# Patient Record
Sex: Female | Born: 1980 | Race: White | Hispanic: No | Marital: Single | Smoking: Never smoker
Health system: Southern US, Community
[De-identification: ages and names within clinical notes are randomized; demographics above are authoritative.]

## PROBLEM LIST (undated history)

## (undated) DIAGNOSIS — A630 Anogenital (venereal) warts: Secondary | ICD-10-CM

## (undated) DIAGNOSIS — F419 Anxiety disorder, unspecified: Secondary | ICD-10-CM

## (undated) DIAGNOSIS — T7840XA Allergy, unspecified, initial encounter: Secondary | ICD-10-CM

## (undated) DIAGNOSIS — D649 Anemia, unspecified: Secondary | ICD-10-CM

## (undated) DIAGNOSIS — R87629 Unspecified abnormal cytological findings in specimens from vagina: Secondary | ICD-10-CM

## (undated) DIAGNOSIS — S3992XA Unspecified injury of lower back, initial encounter: Secondary | ICD-10-CM

## (undated) DIAGNOSIS — S62609A Fracture of unspecified phalanx of unspecified finger, initial encounter for closed fracture: Secondary | ICD-10-CM

## (undated) DIAGNOSIS — J45909 Unspecified asthma, uncomplicated: Secondary | ICD-10-CM

## (undated) DIAGNOSIS — Z9889 Other specified postprocedural states: Secondary | ICD-10-CM

## (undated) HISTORY — PX: FRACTURE SURGERY: SHX138

## (undated) HISTORY — DX: Allergy, unspecified, initial encounter: T78.40XA

## (undated) HISTORY — PX: CERVICAL BIOPSY  W/ LOOP ELECTRODE EXCISION: SUR135

## (undated) HISTORY — DX: Unspecified abnormal cytological findings in specimens from vagina: R87.629

## (undated) HISTORY — PX: SPINE SURGERY: SHX786

## (undated) HISTORY — PX: BACK SURGERY: SHX140

## (undated) HISTORY — DX: Anogenital (venereal) warts: A63.0

## (undated) HISTORY — DX: Anemia, unspecified: D64.9

## (undated) HISTORY — PX: NASAL SINUS SURGERY: SHX719

---

## 2009-03-27 DIAGNOSIS — Z9889 Other specified postprocedural states: Secondary | ICD-10-CM

## 2009-03-27 HISTORY — DX: Other specified postprocedural states: Z98.890

## 2015-12-17 DIAGNOSIS — G8929 Other chronic pain: Secondary | ICD-10-CM | POA: Insufficient documentation

## 2016-05-05 DIAGNOSIS — Z8781 Personal history of (healed) traumatic fracture: Secondary | ICD-10-CM

## 2016-05-05 DIAGNOSIS — E041 Nontoxic single thyroid nodule: Secondary | ICD-10-CM | POA: Insufficient documentation

## 2016-05-05 HISTORY — DX: Personal history of (healed) traumatic fracture: Z87.81

## 2018-09-30 ENCOUNTER — Inpatient Hospital Stay (HOSPITAL_COMMUNITY): Payer: 59

## 2018-09-30 ENCOUNTER — Other Ambulatory Visit: Payer: Self-pay

## 2018-09-30 ENCOUNTER — Encounter (HOSPITAL_COMMUNITY): Payer: Self-pay | Admitting: *Deleted

## 2018-09-30 ENCOUNTER — Inpatient Hospital Stay (HOSPITAL_COMMUNITY)
Admission: AD | Admit: 2018-09-30 | Discharge: 2018-09-30 | Disposition: A | Discharge status: Home / Self Care | Payer: 59 | Attending: Obstetrics and Gynecology | Admitting: Obstetrics and Gynecology

## 2018-09-30 DIAGNOSIS — O4690 Antepartum hemorrhage, unspecified, unspecified trimester: Secondary | ICD-10-CM

## 2018-09-30 DIAGNOSIS — O4691 Antepartum hemorrhage, unspecified, first trimester: Secondary | ICD-10-CM

## 2018-09-30 DIAGNOSIS — O021 Missed abortion: Secondary | ICD-10-CM | POA: Insufficient documentation

## 2018-09-30 DIAGNOSIS — Z3A12 12 weeks gestation of pregnancy: Secondary | ICD-10-CM

## 2018-09-30 DIAGNOSIS — Z6790 Unspecified blood type, Rh positive: Secondary | ICD-10-CM

## 2018-09-30 HISTORY — DX: Other specified postprocedural states: Z98.890

## 2018-09-30 HISTORY — DX: Fracture of unspecified phalanx of unspecified finger, initial encounter for closed fracture: S62.609A

## 2018-09-30 HISTORY — DX: Unspecified injury of lower back, initial encounter: S39.92XA

## 2018-09-30 LAB — CBC
HCT: 41.9 % (ref 36.0–46.0)
Hemoglobin: 14.1 g/dL (ref 12.0–15.0)
MCH: 29.6 pg (ref 26.0–34.0)
MCHC: 33.7 g/dL (ref 30.0–36.0)
MCV: 87.8 fL (ref 80.0–100.0)
Platelets: 378 10*3/uL (ref 150–400)
RBC: 4.77 MIL/uL (ref 3.87–5.11)
RDW: 12.5 % (ref 11.5–15.5)
WBC: 10.5 10*3/uL (ref 4.0–10.5)
nRBC: 0 % (ref 0.0–0.2)

## 2018-09-30 LAB — POCT PREGNANCY, URINE: Preg Test, Ur: POSITIVE — AB

## 2018-09-30 MED ORDER — HYDROCODONE-ACETAMINOPHEN 5-325 MG PO TABS: 1.0000 | ORAL_TABLET | Freq: Four times a day (QID) | ORAL | 0 refills | Status: DC | PRN

## 2018-09-30 NOTE — Discharge Instructions (Signed)
Miscarriage °A miscarriage is the loss of an unborn baby (fetus) before the 20th week of pregnancy. Most miscarriages happen during the first 3 months of pregnancy. Sometimes, a miscarriage can happen before a woman knows that she is pregnant. °Having a miscarriage can be an emotional experience. If you have had a miscarriage, talk with your health care provider about any questions you may have about miscarrying, the grieving process, and your plans for future pregnancy. °What are the causes? °A miscarriage may be caused by: °· Problems with the genes or chromosomes of the fetus. These problems make it impossible for the baby to develop normally. They are often the result of random errors that occur early in the development of the baby, and are not passed from parent to child (not inherited). °· Infection of the cervix or uterus. °· Conditions that affect hormone balance in the body. °· Problems with the cervix, such as the cervix opening and thinning before pregnancy is at term (cervical insufficiency). °· Problems with the uterus. These may include: °? A uterus with an abnormal shape. °? Fibroids in the uterus. °? Congenital abnormalities. These are problems that were present at birth. °· Certain medical conditions. °· Smoking, drinking alcohol, or using drugs. °· Injury (trauma). °In many cases, the cause of a miscarriage is not known. °What are the signs or symptoms? °Symptoms of this condition include: °· Vaginal bleeding or spotting, with or without cramps or pain. °· Pain or cramping in the abdomen or lower back. °· Passing fluid, tissue, or blood clots from the vagina. °How is this diagnosed? °This condition may be diagnosed based on: °· A physical exam. °· Ultrasound. °· Blood tests. °· Urine tests. °How is this treated? °Treatment for a miscarriage is sometimes not necessary if you naturally pass all the tissue that was in your uterus. If necessary, this condition may be treated with: °· Dilation and  curettage (D&C). This is a procedure in which the cervix is stretched open and the lining of the uterus (endometrium) is scraped. This is done only if tissue from the fetus or placenta remains in the body (incomplete miscarriage). °· Medicines, such as: °? Antibiotic medicine, to treat infection. °? Medicine to help the body pass any remaining tissue. °? Medicine to reduce (contract) the size of the uterus. These medicines may be given if you have a lot of bleeding. °If you have Rh negative blood and your baby was Rh positive, you will need a shot of a medicine called Rh immunoglobulinto protect your future babies from Rh blood problems. "Rh-negative" and "Rh-positive" refer to whether or not the blood has a specific protein found on the surface of red blood cells (Rh factor). °Follow these instructions at home: °Medicines ° °· Take over-the-counter and prescription medicines only as told by your health care provider. °· If you were prescribed antibiotic medicine, take it as told by your health care provider. Do not stop taking the antibiotic even if you start to feel better. °· Do not take NSAIDs, such as aspirin and ibuprofen, unless they are approved by your health care provider. These medicines can cause bleeding. °Activity °· Rest as directed. Ask your health care provider what activities are safe for you. °· Have someone help with home and family responsibilities during this time. °General instructions °· Keep track of the number of sanitary pads you use each day and how soaked (saturated) they are. Write down this information. °· Monitor the amount of tissue or blood clots that   you pass from your vagina. Save any large amounts of tissue for your health care provider to examine. °· Do not use tampons, douche, or have sex until your health care provider approves. °· To help you and your partner with the process of grieving, talk with your health care provider or seek counseling. °· When you are ready, meet with  your health care provider to discuss any important steps you should take for your health. Also, discuss steps you should take to have a healthy pregnancy in the future. °· Keep all follow-up visits as told by your health care provider. This is important. °Where to find more information °· The American Congress of Obstetricians and Gynecologists: www.acog.org °· U.[place]. Department of Health and Human Services Office of Women’s Health: www.womenshealth.gov °Contact a health care provider if: °· You have a fever or chills. °· You have a foul smelling vaginal discharge. °· You have more bleeding instead of less. °Get help right away if: °· You have severe cramps or pain in your back or abdomen. °· You pass blood clots or tissue from your vagina that is walnut-sized or larger. °· You soak more than 1 regular sanitary pad in an hour. °· You become light-headed or weak. °· You pass out. °· You have feelings of sadness that take over your thoughts, or you have thoughts of hurting yourself. °Summary °· Most miscarriages happen in the first 3 months of pregnancy. Sometimes miscarriage happens before a woman even knows that she is pregnant. °· Follow your health care provider's instruction for home care. Keep all follow-up appointments. °· To help you and your partner with the process of grieving, talk with your health care provider or seek counseling. °This information is not intended to replace advice given to you by your health care provider. Make sure you discuss any questions you have with your health care provider. °Document Released: 09/06/2000 Document Revised: 07/05/2018 Document Reviewed: 04/18/2016 °Elsevier Patient Education © 2020 [employer] ° °

## 2018-09-30 NOTE — MAU Provider Note (Signed)
History     CSN: [id number]  Arrival date and time: 09/30/18 2229   First Provider Initiated Contact with Patient 09/30/18 0830      Chief Complaint  Patient presents with  . Vaginal Bleeding  . Abdominal Pain   38 y.o. G1 @12  wks by LMP presenting for LAP and bleeding. Was dx with blighted ovum at [facility] ED yesterday, pt brought records. Reports worsening cramping today. Rates 4/10. Took a muscle relaxer, helped a little. Reports moderate amt of bleeding and passing small clots, has not soaked a pad, has not passed tissue. Reports having [place] in the office 2 weeks ago that was measuring 6 wks and no FP.  OB History    Gravida  1   Para  0   Term  0   Preterm  0   AB  0   Living  0     SAB  0   TAB  0   Ectopic  0   Multiple  0   Live Births  0           Past Medical History:  Diagnosis Date  . Back injury   . Broken finger   . H/O sinus surgery   . History of back surgery     Past Surgical History:  Procedure Laterality Date  . BACK SURGERY      History reviewed. No pertinent family history.  Social History   Tobacco Use  . Smoking status: Never Smoker  . Smokeless tobacco: Never Used  Substance Use Topics  . Alcohol use: Not Currently    Comment: socially  . Drug use: Never    Allergies:  Allergies  Allergen Reactions  . Sulfa Antibiotics Anaphylaxis    Stomach pain   . Cauliflower [Brassica Oleracea Italica]   . Latex Swelling  . Zithromax [Azithromycin]     Stomach pains   . Zyrtec [Cetirizine] Hives    No medications prior to admission.    Review of Systems  Constitutional: Negative for fever.  Gastrointestinal: Positive for abdominal pain.  Genitourinary: Positive for vaginal bleeding.   Physical Exam   Blood pressure 118/76, pulse (!) 104, temperature 98.1 F (36.7 C), temperature source Oral, resp. rate 18, weight 66.7 kg, last menstrual period 07/16/2018, SpO2 100 %.  Physical Exam  Nursing note and vitals  reviewed. Constitutional: She is oriented to person, place, and time. She appears well-developed and well-nourished. No distress.  HENT:  Head: Normocephalic and atraumatic.  Neck: Normal range of motion.  Cardiovascular: Normal rate.  Respiratory: Effort normal. No respiratory distress.  GI: Soft. She exhibits no distension and no mass. There is no abdominal tenderness. There is no rebound and no guarding.  Genitourinary:    Genitourinary Comments: External: no lesions or erythema Vagina: rugated, pink, moist, small bloody discharge, cleared with 1 fox swab Uterus: non enlarged, anteverted, + tender, no CMT Adnexae: no masses, no tenderness left, no tenderness right Cervix closed    Musculoskeletal: Normal range of motion.  Neurological: She is alert and oriented to person, place, and time.  Skin: Skin is warm and dry.  Psychiatric: She has a normal mood and affect.   Results for orders placed or performed during the hospital encounter of 09/30/18 (from the past 24 hour(s))  Pregnancy, urine POC     Status: Abnormal   Collection Time: 09/30/18  8:36 AM  Result Value Ref Range   Preg Test, Ur POSITIVE (A) NEGATIVE  CBC  Status: None   Collection Time: 09/30/18  9:18 AM  Result Value Ref Range   WBC 10.5 4.0 - 10.5 K/uL   RBC 4.77 3.87 - 5.11 MIL/uL   Hemoglobin 14.1 12.0 - 15.0 g/dL   HCT 16.141.9 09.636.0 - 04.546.0 %   MCV 87.8 80.0 - 100.0 fL   MCH 29.6 26.0 - 34.0 pg   MCHC 33.7 30.0 - 36.0 g/dL   RDW 40.912.5 81.111.5 - 91.415.5 %   Platelets 378 150 - 400 K/uL   nRBC 0.0 0.0 - 0.2 %   MAU Course  Procedures  MDM Review of [facility] records from 09/29/18: US shows IUGS measuring 26mm corresponding to GA of 7362w6d with YS but no FP (meets criteria for failed pregnancy), normal ovaries. Quant HCG 13,760. ABO: O pos. US and labs not repeated, not indicated. No evidence of infection or anemia. Plan for expectant mngt since she is already bleeding, will provide short course of pain meds and have her  f/u with her primary OB. Offered analgesia here but she does not have a ride, so will provide Rx. Stable for discharge home.   Assessment and Plan   1. Missed abortion   2. Vaginal bleeding in pregnancy   3. Blood type, Rh positive    Discharge [facility] Return precautions Rx Vicodin 5/325 1-2 q6 prn #8  Allergies as of 09/30/2018      Reactions   Sulfa Antibiotics Anaphylaxis   Stomach pain   Cauliflower [brassica Oleracea Italica]    Latex Swelling   Zithromax [azithromycin]    Stomach pains   Zyrtec [cetirizine] Hives      Medication List    TAKE these medications   HYDROcodone-acetaminophen 5-325 MG tablet Commonly known as: NORCO/VICODIN Take 1-2 tablets by mouth every 6 (six) hours as needed for moderate pain or severe pain.      Donette LarryMelanie Jdyn Parkerson, CNM 09/30/2018, 9:54 AM

## 2018-09-30 NOTE — MAU Note (Signed)
Patient was presents to MAU with c/o vaginal bleeding and abdominal pain. States she was diagnosed with a blighted ovum at [facility] ED yesterday and having more pain and heavier bleeding today. Reports dime sized clots.  Rates pain 4/10.  Took muscle relaxer at 0750 with hx of back problems.

## 2018-12-17 LAB — OB RESULTS CONSOLE GC/CHLAMYDIA
Chlamydia: NEGATIVE
Gonorrhea: NEGATIVE

## 2018-12-17 LAB — OB RESULTS CONSOLE ABO/RH: RH Type: POSITIVE

## 2018-12-17 LAB — OB RESULTS CONSOLE RUBELLA ANTIBODY, IGM: Rubella: IMMUNE

## 2018-12-17 LAB — OB RESULTS CONSOLE HEPATITIS B SURFACE ANTIGEN: Hepatitis B Surface Ag: NEGATIVE

## 2018-12-17 LAB — OB RESULTS CONSOLE RPR: RPR: NONREACTIVE

## 2018-12-17 LAB — OB RESULTS CONSOLE HIV ANTIBODY (ROUTINE TESTING): HIV: NONREACTIVE

## 2018-12-17 LAB — OB RESULTS CONSOLE ANTIBODY SCREEN: Antibody Screen: NEGATIVE

## 2019-02-23 ENCOUNTER — Inpatient Hospital Stay (HOSPITAL_BASED_OUTPATIENT_CLINIC_OR_DEPARTMENT_OTHER): Payer: 59

## 2019-02-23 ENCOUNTER — Inpatient Hospital Stay (HOSPITAL_COMMUNITY)
Admission: AD | Admit: 2019-02-23 | Discharge: 2019-02-23 | Disposition: A | Discharge status: Home / Self Care | Payer: 59 | Attending: Obstetrics & Gynecology | Admitting: Obstetrics & Gynecology

## 2019-02-23 ENCOUNTER — Other Ambulatory Visit: Payer: Self-pay

## 2019-02-23 ENCOUNTER — Encounter (HOSPITAL_COMMUNITY): Payer: Self-pay

## 2019-02-23 DIAGNOSIS — O99891 Other specified diseases and conditions complicating pregnancy: Secondary | ICD-10-CM | POA: Diagnosis not present

## 2019-02-23 DIAGNOSIS — Z882 Allergy status to sulfonamides status: Secondary | ICD-10-CM | POA: Diagnosis not present

## 2019-02-23 DIAGNOSIS — O09892 Supervision of other high risk pregnancies, second trimester: Secondary | ICD-10-CM | POA: Diagnosis not present

## 2019-02-23 DIAGNOSIS — O9A212 Injury, poisoning and certain other consequences of external causes complicating pregnancy, second trimester: Secondary | ICD-10-CM | POA: Diagnosis not present

## 2019-02-23 DIAGNOSIS — Y92410 Unspecified street and highway as the place of occurrence of the external cause: Secondary | ICD-10-CM | POA: Insufficient documentation

## 2019-02-23 DIAGNOSIS — Z91040 Latex allergy status: Secondary | ICD-10-CM | POA: Insufficient documentation

## 2019-02-23 DIAGNOSIS — Z888 Allergy status to other drugs, medicaments and biological substances status: Secondary | ICD-10-CM | POA: Insufficient documentation

## 2019-02-23 DIAGNOSIS — R109 Unspecified abdominal pain: Secondary | ICD-10-CM | POA: Diagnosis not present

## 2019-02-23 DIAGNOSIS — Z91018 Allergy to other foods: Secondary | ICD-10-CM | POA: Diagnosis not present

## 2019-02-23 DIAGNOSIS — Z3A21 21 weeks gestation of pregnancy: Secondary | ICD-10-CM | POA: Insufficient documentation

## 2019-02-23 DIAGNOSIS — Z881 Allergy status to other antibiotic agents status: Secondary | ICD-10-CM | POA: Diagnosis not present

## 2019-02-23 DIAGNOSIS — M7918 Myalgia, other site: Secondary | ICD-10-CM
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
## 2019-02-23 MED ORDER — ACETAMINOPHEN 500 MG PO TABS
1000.0000 mg | ORAL_TABLET | Freq: Once | ORAL | Status: AC
  Administered 2019-02-23: 17:00:00 1000 mg via ORAL
  Filled 2019-02-23: qty 2
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
## 2019-02-23 NOTE — MAU Note (Signed)
Urine in lab 

## 2019-02-23 NOTE — Discharge Instructions (Signed)
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
## 2019-04-14 ENCOUNTER — Encounter (HOSPITAL_COMMUNITY): Payer: Self-pay | Admitting: Obstetrics and Gynecology

## 2019-04-14 ENCOUNTER — Observation Stay (HOSPITAL_COMMUNITY)
Admission: AD | Admit: 2019-04-14 | Discharge: 2019-04-15 | Disposition: A | Discharge status: Home / Self Care | Payer: 59 | Attending: Obstetrics and Gynecology | Admitting: Obstetrics and Gynecology

## 2019-04-14 ENCOUNTER — Observation Stay (HOSPITAL_COMMUNITY): Payer: 59

## 2019-04-14 ENCOUNTER — Other Ambulatory Visit: Payer: Self-pay

## 2019-04-14 DIAGNOSIS — O365990 Maternal care for other known or suspected poor fetal growth, unspecified trimester, not applicable or unspecified: Secondary | ICD-10-CM | POA: Diagnosis present

## 2019-04-14 DIAGNOSIS — Z9889 Other specified postprocedural states: Secondary | ICD-10-CM

## 2019-04-14 DIAGNOSIS — O09523 Supervision of elderly multigravida, third trimester: Secondary | ICD-10-CM | POA: Diagnosis not present

## 2019-04-14 DIAGNOSIS — Z882 Allergy status to sulfonamides status: Secondary | ICD-10-CM | POA: Insufficient documentation

## 2019-04-14 DIAGNOSIS — M62838 Other muscle spasm: Secondary | ICD-10-CM | POA: Diagnosis not present

## 2019-04-14 DIAGNOSIS — Z3A28 28 weeks gestation of pregnancy: Secondary | ICD-10-CM | POA: Diagnosis not present

## 2019-04-14 DIAGNOSIS — O26893 Other specified pregnancy related conditions, third trimester: Secondary | ICD-10-CM | POA: Insufficient documentation

## 2019-04-14 DIAGNOSIS — R87612 Low grade squamous intraepithelial lesion on cytologic smear of cervix (LGSIL): Secondary | ICD-10-CM

## 2019-04-14 DIAGNOSIS — Z888 Allergy status to other drugs, medicaments and biological substances status: Secondary | ICD-10-CM | POA: Diagnosis not present

## 2019-04-14 DIAGNOSIS — O4700 False labor before 37 completed weeks of gestation, unspecified trimester: Secondary | ICD-10-CM | POA: Diagnosis present

## 2019-04-14 DIAGNOSIS — Z881 Allergy status to other antibiotic agents status: Secondary | ICD-10-CM | POA: Insufficient documentation

## 2019-04-14 DIAGNOSIS — O282 Abnormal cytological finding on antenatal screening of mother: Secondary | ICD-10-CM | POA: Insufficient documentation

## 2019-04-14 DIAGNOSIS — Z91040 Latex allergy status: Secondary | ICD-10-CM | POA: Insufficient documentation

## 2019-04-14 DIAGNOSIS — O26879 Cervical shortening, unspecified trimester: Secondary | ICD-10-CM

## 2019-04-14 DIAGNOSIS — N883 Incompetence of cervix uteri: Secondary | ICD-10-CM

## 2019-04-14 DIAGNOSIS — Z20822 Contact with and (suspected) exposure to covid-19: Secondary | ICD-10-CM | POA: Insufficient documentation

## 2019-04-14 DIAGNOSIS — O26873 Cervical shortening, third trimester: Principal | ICD-10-CM | POA: Insufficient documentation

## 2019-04-14 HISTORY — DX: Low grade squamous intraepithelial lesion on cytologic smear of cervix (LGSIL): R87.612

## 2019-04-14 HISTORY — DX: Cervical shortening, unspecified trimester: O26.879

## 2019-04-14 LAB — TYPE AND SCREEN
ABO/RH(D): O POS
Antibody Screen: NEGATIVE

## 2019-04-14 LAB — CBC WITH DIFFERENTIAL/PLATELET
Abs Immature Granulocytes: 0.17 10*3/uL — ABNORMAL HIGH (ref 0.00–0.07)
Basophils Absolute: 0.1 10*3/uL (ref 0.0–0.1)
Basophils Relative: 1 %
Eosinophils Absolute: 0.2 10*3/uL (ref 0.0–0.5)
Eosinophils Relative: 1 %
HCT: 36.2 % (ref 36.0–46.0)
Hemoglobin: 12.2 g/dL (ref 12.0–15.0)
Immature Granulocytes: 1 %
Lymphocytes Relative: 15 %
Lymphs Abs: 2 10*3/uL (ref 0.7–4.0)
MCH: 30.2 pg (ref 26.0–34.0)
MCHC: 33.7 g/dL (ref 30.0–36.0)
MCV: 89.6 fL (ref 80.0–100.0)
Monocytes Absolute: 0.9 10*3/uL (ref 0.1–1.0)
Monocytes Relative: 7 %
Neutro Abs: 9.8 10*3/uL — ABNORMAL HIGH (ref 1.7–7.7)
Neutrophils Relative %: 75 %
Platelets: 409 10*3/uL — ABNORMAL HIGH (ref 150–400)
RBC: 4.04 MIL/uL (ref 3.87–5.11)
RDW: 13.6 % (ref 11.5–15.5)
WBC: 13.1 10*3/uL — ABNORMAL HIGH (ref 4.0–10.5)
nRBC: 0 % (ref 0.0–0.2)

## 2019-04-14 LAB — ABO/RH: ABO/RH(D): O POS

## 2019-04-14 LAB — SARS CORONAVIRUS 2 (TAT 6-24 HRS): SARS Coronavirus 2: NEGATIVE

## 2019-04-14 LAB — GROUP B STREP BY PCR: Group B strep by PCR: NEGATIVE

## 2019-04-14 IMAGING — US US MFM OB DETAIL+14 WK
1 series · 13 of 28 positions shown · non-contrast
Comparison: none

[Series 1: us mfm ob detail+14 wk · 13 of 61 slices shown]
[im 3/61]
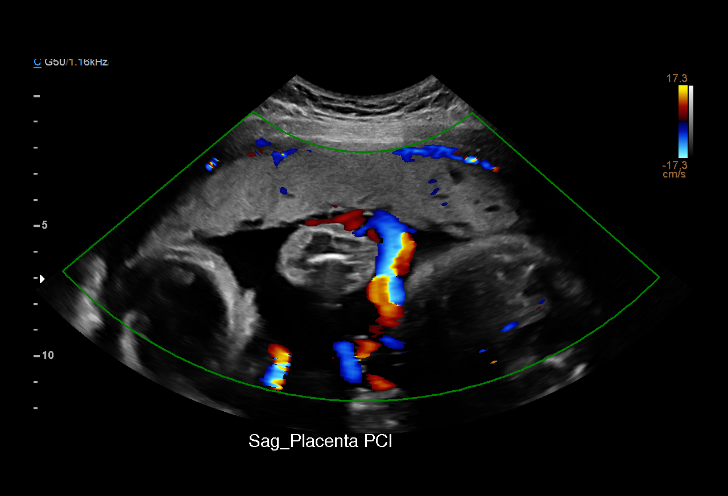
[im 7/61]
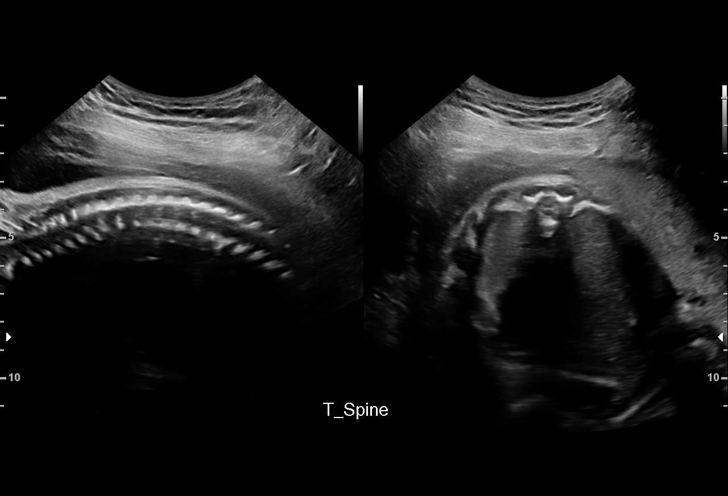
[im 12/61]
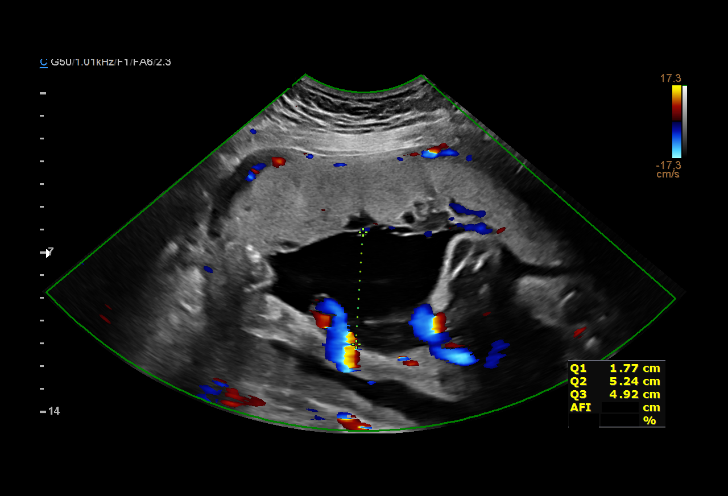
[im 16/61]
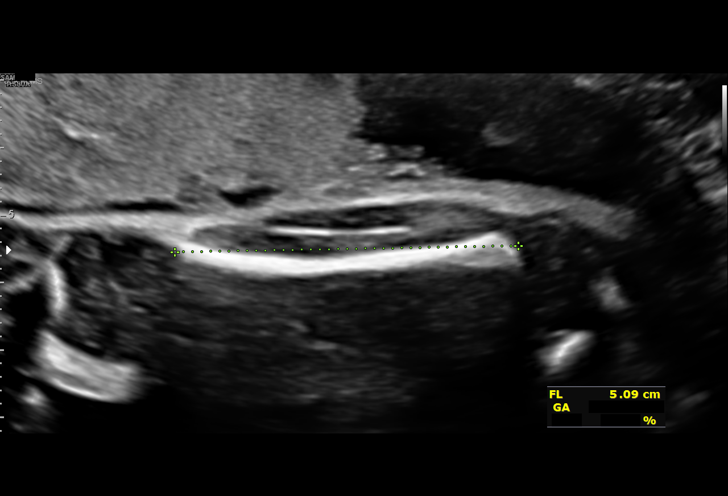
[im 21/61]
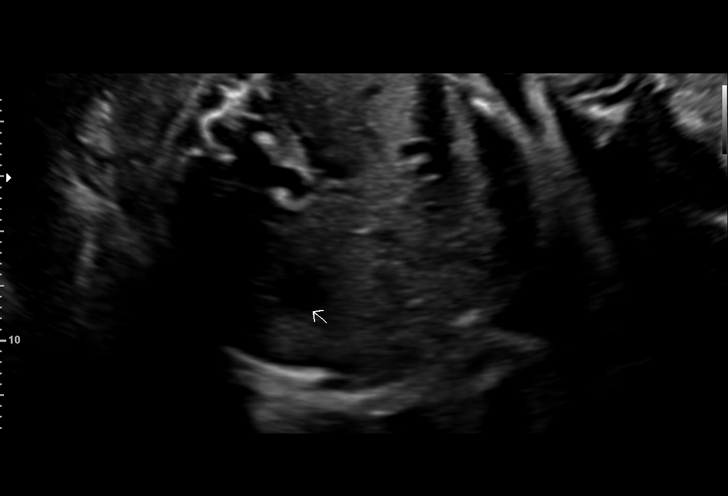
[im 25/61]
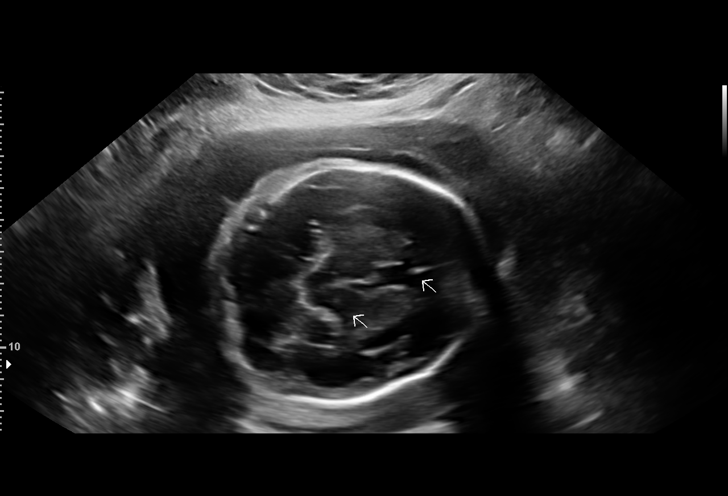
[im 32/61]
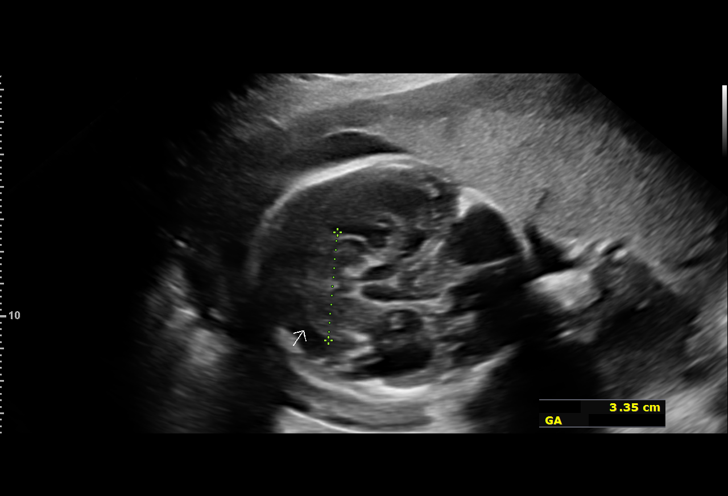
[im 36/61]
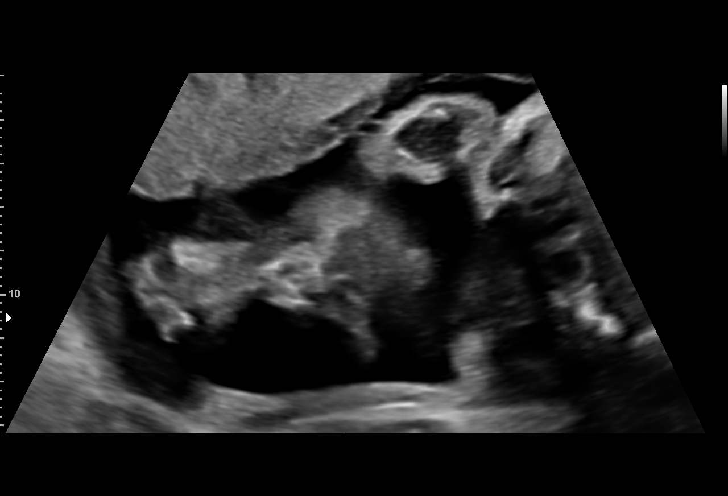
[im 41/61]
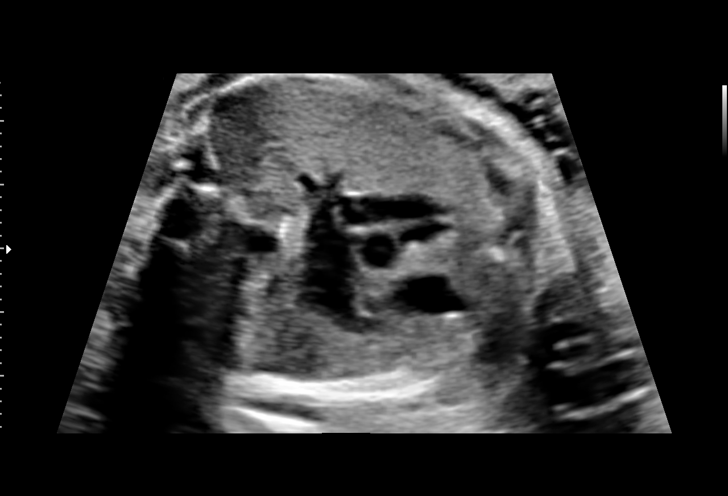
[im 45/61]
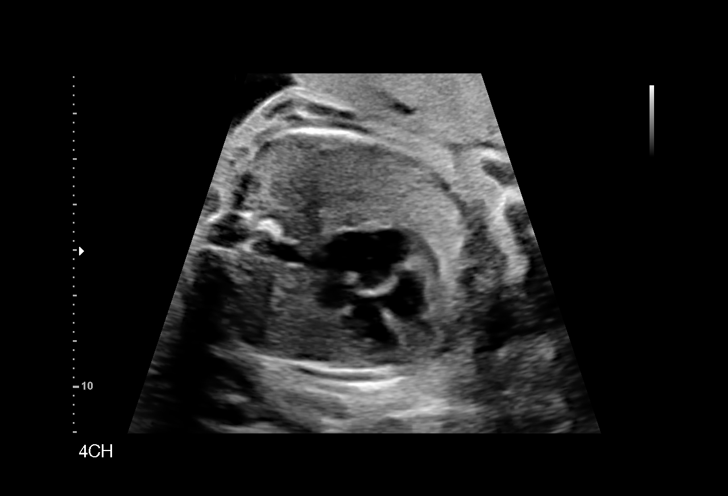
[im 49/61]
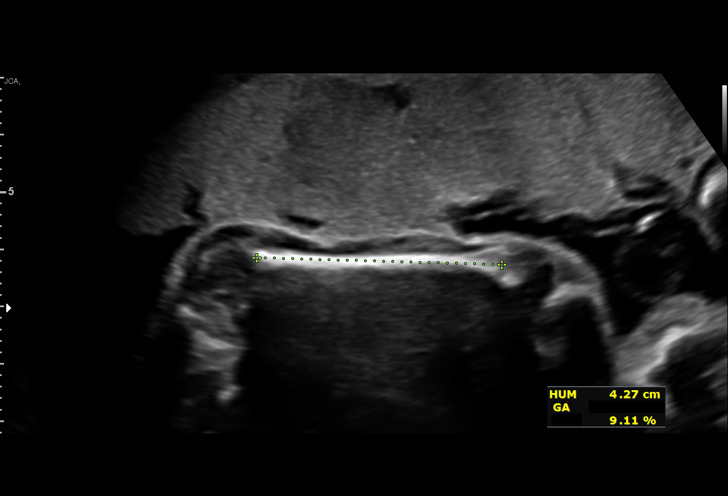
[im 54/61]
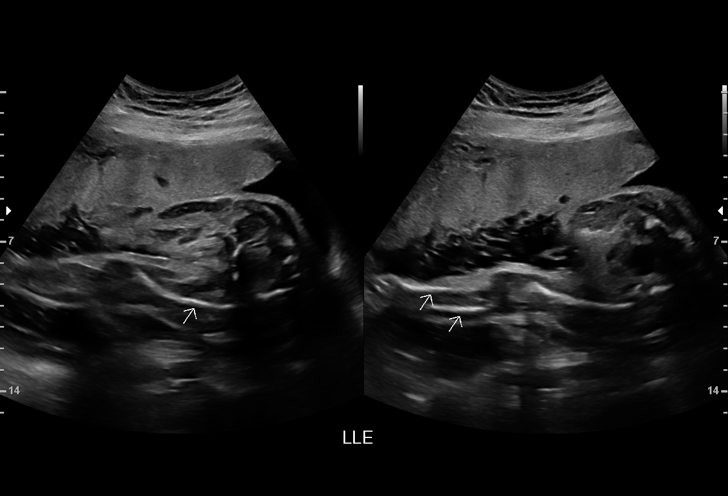
[im 58/61]
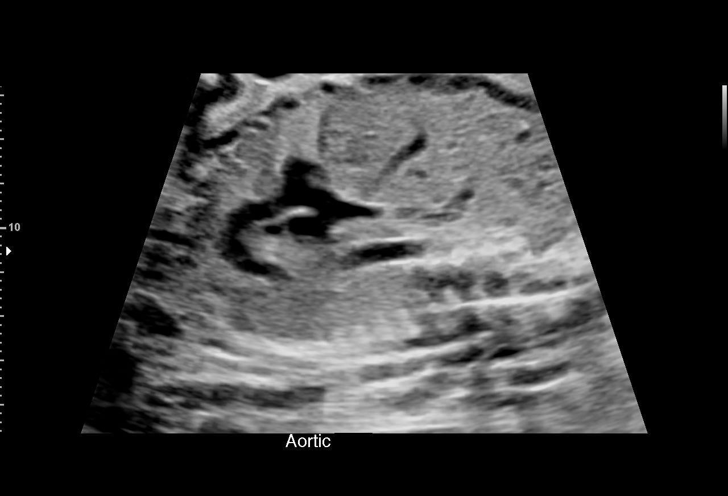

[13 of 28 positions shown; findings below may reference images not displayed]

Attending:        SZABO      Secondary Phy.:   [facility] MAU/Triage
 Referred By:      SZABO           [facility]]

  2  US MFM UA CORD DOPPLER               76820.02     SZABO
 ----------------------------------------------------------------------

 ----------------------------------------------------------------------
Indications

  Encounter for antenatal screening for          [KB]
  malformations
  28 weeks gestation of pregnancy
  Cervical shortening, third trimester           [KB]
  Advanced maternal age multigravida 35+,        [KB]
  third trimester
 ----------------------------------------------------------------------
Fetal Evaluation

 Num Of Fetuses:         1
 Fetal Heart Rate(bpm):  159
 Cardiac Activity:       Observed
 Presentation:           Breech
 Placenta:               Anterior
 P. Cord Insertion:      Visualized

 Amniotic Fluid
 AFI FV:      Within normal limits

 AFI Sum(cm)     %Tile       Largest Pocket(cm)
 17.74           67

 RUQ(cm)       RLQ(cm)       LUQ(cm)        LLQ(cm)

Biometry

 BPD:      74.6  mm     G. Age:  29w 6d         89  %    CI:        79.42   %    70 - 86
                                                         FL/HC:      18.6   %    18.8 -
 HC:      264.6  mm     G. Age:  28w 6d         39  %    HC/AC:      1.19        1.05 -
 AC:       223   mm     G. Age:  26w 5d          9  %    FL/BPD:     66.1   %    71 - 87
 FL:       49.3  mm     G. Age:  26w 4d          5  %    FL/AC:      22.1   %    20 - 24
 HUM:      45.3  mm     G. Age:  26w 6d         16  %
 CER:      33.5  mm     G. Age:  29w 2d         65  %

 LV:        5.4  mm

 Est. FW:    [KB]  gm      2 lb 4 oz      9  %
OB History

 Gravidity:    2          SAB:   1
Gestational Age

 LMP:           28w 1d        Date:  [DATE]                 EDD:   [DATE]
 U/S Today:     28w 0d                                        EDD:   [DATE]
 Best:          28w 1d     Det. By:  LMP  ([DATE])          EDD:   [DATE]
Anatomy

 Cranium:               Appears normal         Aortic Arch:            Appears normal
 Cavum:                 Appears normal         Ductal Arch:            Not well visualized
 Ventricles:            Appears normal         Diaphragm:              Appears normal
 Choroid Plexus:        Not well visualized    Stomach:                Appears normal, left
                                                                       sided
 Cerebellum:            Appears normal         Abdomen:                Appears normal
 Posterior Fossa:       Not well visualized    Abdominal Wall:         Appears nml (cord
                                                                       insert, abd wall)
 Nuchal Fold:           Not applicable (>20    Cord Vessels:           Appears normal (3
                        wks GA)                                        vessel cord)
 Face:                  Orbits appear          Kidneys:                Not well visualized
                        normal
 Lips:                  Appears normal         Bladder:                Appears normal
 Thoracic:              Appears normal         Spine:                  Limited views
                                                                       appear normal
 Heart:                 Appears normal         Upper Extremities:      Visualized
                        (4CH, axis, and
                        situs)
 RVOT:                  Not well visualized    Lower Extremities:      Appears normal
 LVOT:                  Not well visualized

 Other:  Technically difficult due to advanced gestational age. Technically
         difficult due to fetal position.
Doppler - Fetal Vessels

 Umbilical Artery
  S/D     %tile                                            ADFV    RDFV
  2.4       16                                                No      No

Cervix Uterus Adnexa

 Cervix
 Not visualized (advanced GA >[KB])
Impression

 Intrauterine growth restriction EFW 9th% with AC% 9th%
 Normal UA Dopplers
 Good fetal movement and amniotic fluid volume.
 Suboptimal views of the fetal anatomy was obtained
 secondary to fetal position and advanced gestational age.

 Ms. SZABO is being managed inpatient for evaluation of
 preterm labor. She recieved one dose of steroids with
 another being administered today.
Recommendations

 Initiate weekly UA Dopplers and antenatal testing per SZABO
 SZABO note.
 Follow up growth in 3 weeks.

## 2019-04-14 IMAGING — US US MFM UA CORD DOPPLER
1 series · 4 of 4 positions shown · non-contrast
Comparison: none

[Series 5: us mfm ua cord doppler · 4 of 4 slices shown]
[im 1/4]
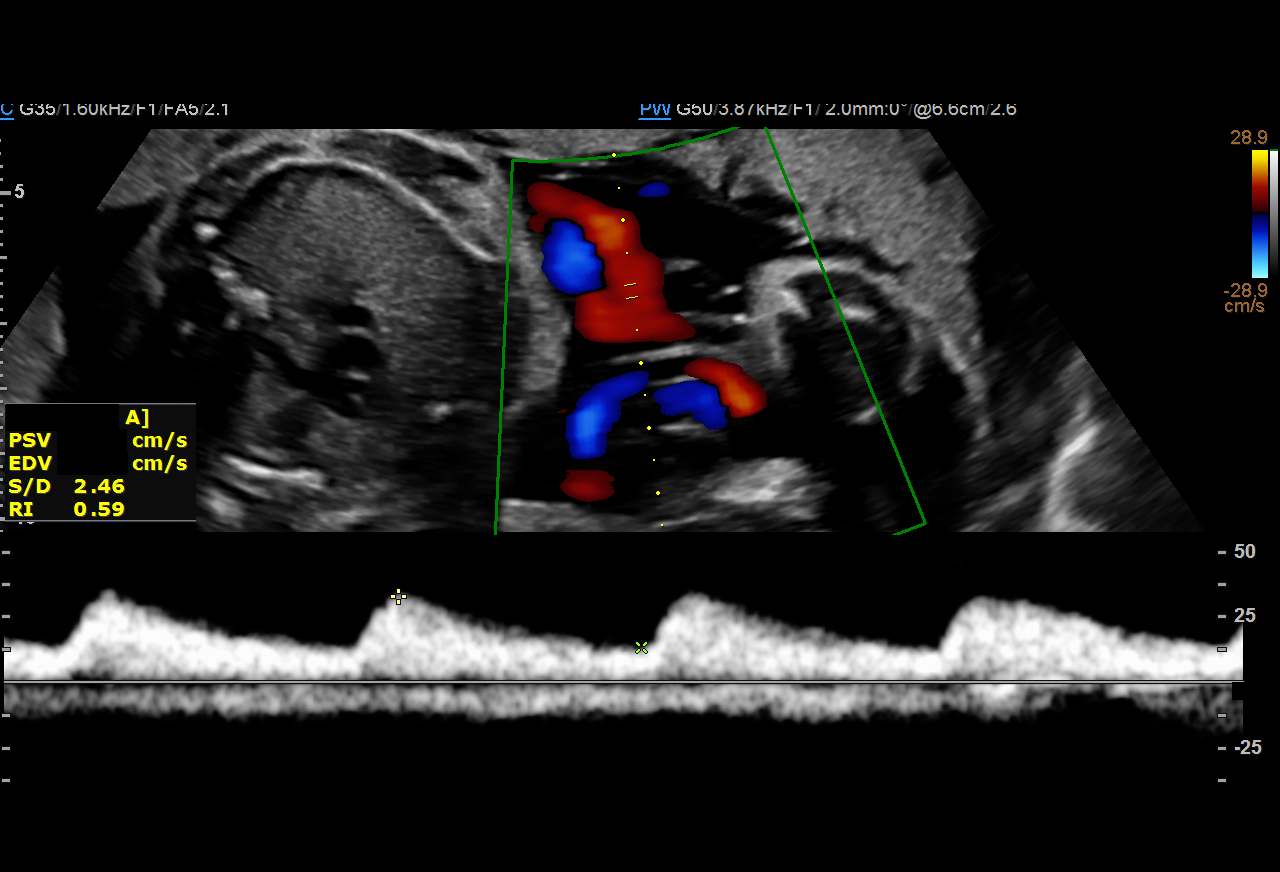
[im 2/4]
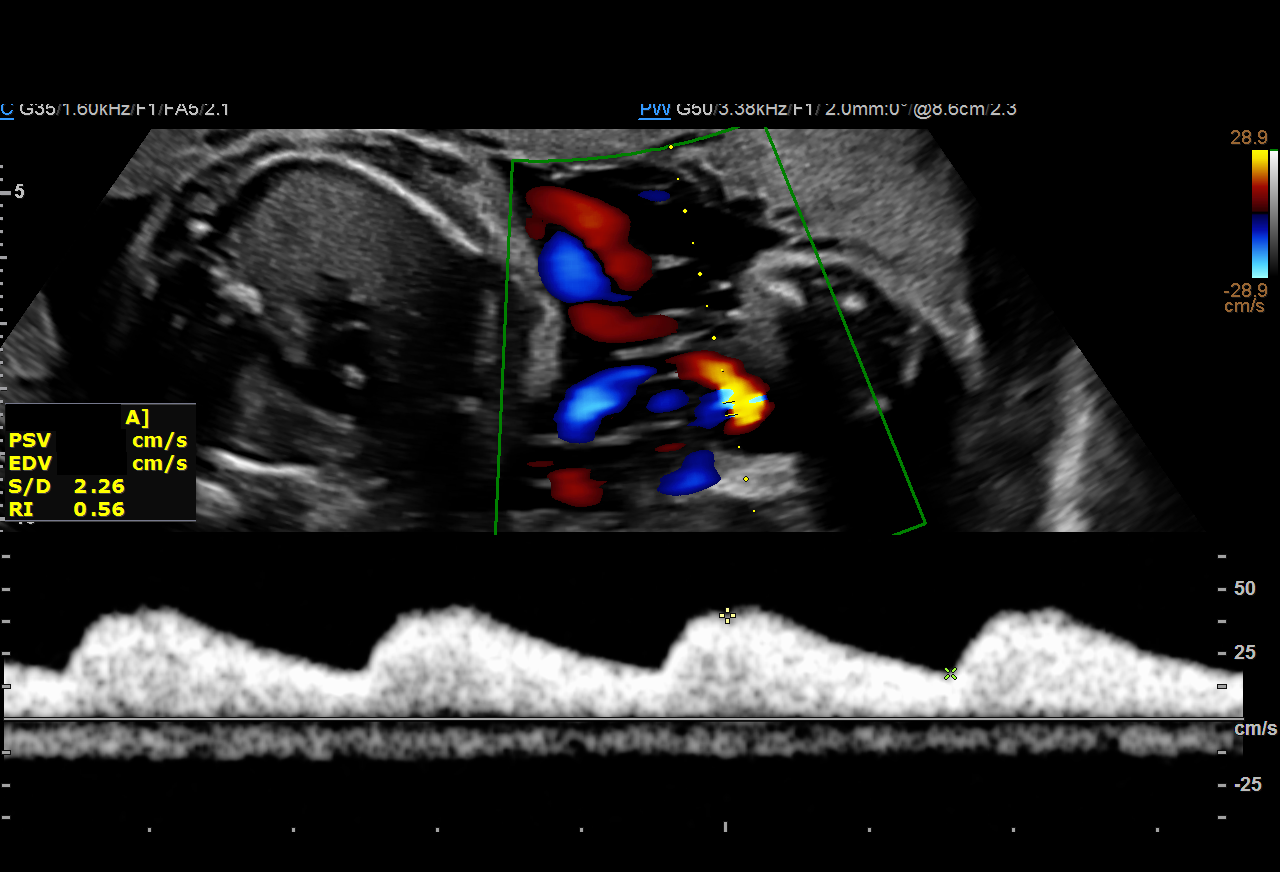
[im 3/4]
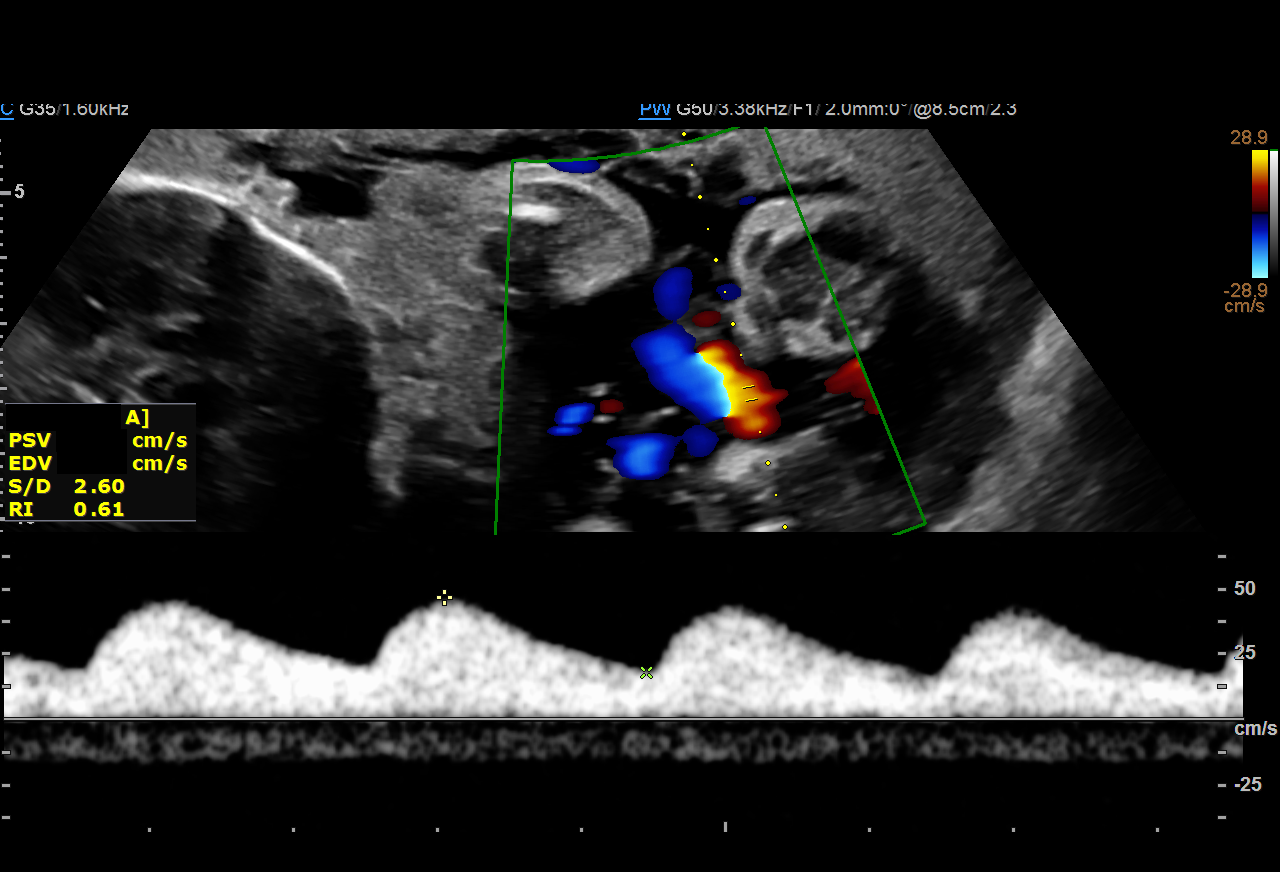
[im 4/4]
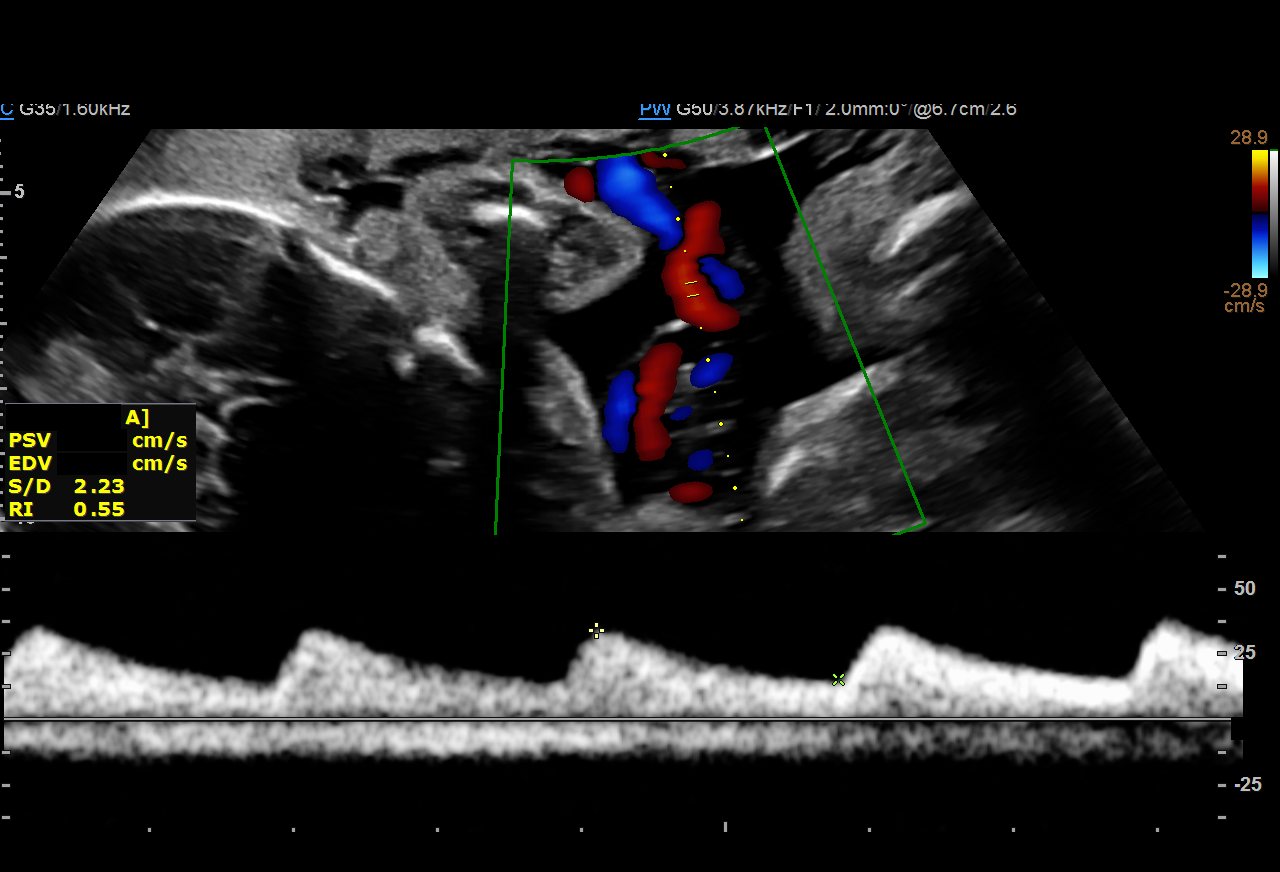

[4 of 4 positions shown; findings below may reference images not displayed]

Attending:        SZABO      Secondary Phy.:   [facility] MAU/Triage
 Referred By:      SZABO           [facility]

  2  US MFM UA CORD DOPPLER               76820.02     SZABO
 ----------------------------------------------------------------------

 ----------------------------------------------------------------------
Indications

  Encounter for antenatal screening for          [KB]
  malformations
  28 weeks gestation of pregnancy
  Cervical shortening, third trimester           [KB]
  Advanced maternal age multigravida 35+,        [KB]
  third trimester
 ----------------------------------------------------------------------
Fetal Evaluation

 Num Of Fetuses:         1
 Fetal Heart Rate(bpm):  159
 Cardiac Activity:       Observed
 Presentation:           Breech
 Placenta:               Anterior
 P. Cord Insertion:      Visualized

 Amniotic Fluid
 AFI FV:      Within normal limits

 AFI Sum(cm)     %Tile       Largest Pocket(cm)
 17.74           67

 RUQ(cm)       RLQ(cm)       LUQ(cm)        LLQ(cm)

Biometry

 BPD:      74.6  mm     G. Age:  29w 6d         89  %    CI:        79.42   %    70 - 86
                                                         FL/HC:      18.6   %    18.8 -
 HC:      264.6  mm     G. Age:  28w 6d         39  %    HC/AC:      1.19        1.05 -
 AC:       223   mm     G. Age:  26w 5d          9  %    FL/BPD:     66.1   %    71 - 87
 FL:       49.3  mm     G. Age:  26w 4d          5  %    FL/AC:      22.1   %    20 - 24
 HUM:      45.3  mm     G. Age:  26w 6d         16  %
 CER:      33.5  mm     G. Age:  29w 2d         65  %

 LV:        5.4  mm

 Est. FW:    [KB]  gm      2 lb 4 oz      9  %
OB History

 Gravidity:    2          SAB:   1
Gestational Age

 LMP:           28w 1d        Date:  [DATE]                 EDD:   [DATE]
 U/S Today:     28w 0d                                        EDD:   [DATE]
 Best:          28w 1d     Det. By:  LMP  ([DATE])          EDD:   [DATE]
Anatomy

 Cranium:               Appears normal         Aortic Arch:            Appears normal
 Cavum:                 Appears normal         Ductal Arch:            Not well visualized
 Ventricles:            Appears normal         Diaphragm:              Appears normal
 Choroid Plexus:        Not well visualized    Stomach:                Appears normal, left
                                                                       sided
 Cerebellum:            Appears normal         Abdomen:                Appears normal
 Posterior Fossa:       Not well visualized    Abdominal Wall:         Appears nml (cord
                                                                       insert, abd wall)
 Nuchal Fold:           Not applicable (>20    Cord Vessels:           Appears normal (3
                        wks GA)                                        vessel cord)
 Face:                  Orbits appear          Kidneys:                Not well visualized
                        normal
 Lips:                  Appears normal         Bladder:                Appears normal
 Thoracic:              Appears normal         Spine:                  Limited views
                                                                       appear normal
 Heart:                 Appears normal         Upper Extremities:      Visualized
                        (4CH, axis, and
                        situs)
 RVOT:                  Not well visualized    Lower Extremities:      Appears normal
 LVOT:                  Not well visualized

 Other:  Technically difficult due to advanced gestational age. Technically
         difficult due to fetal position.
Doppler - Fetal Vessels

 Umbilical Artery
  S/D     %tile                                            ADFV    RDFV
  2.4       16                                                No      No

Cervix Uterus Adnexa

 Cervix
 Not visualized (advanced GA >[KB])
Impression

 Intrauterine growth restriction EFW 9th% with AC% 9th%
 Normal UA Dopplers
 Good fetal movement and amniotic fluid volume.
 Suboptimal views of the fetal anatomy was obtained
 secondary to fetal position and advanced gestational age.

 Ms. SZABO is being managed inpatient for evaluation of
 preterm labor. She recieved one dose of steroids with
 another being administered today.
Recommendations

 Initiate weekly UA Dopplers and antenatal testing per SZABO
 SZABO note.
 Follow up growth in 3 weeks.

## 2019-04-14 MED ORDER — LACTATED RINGERS IV SOLN: INTRAVENOUS | Status: DC

## 2019-04-14 MED ORDER — LACTATED RINGERS IV BOLUS
1000.0000 mL | Freq: Once | INTRAVENOUS | Status: AC
  Administered 2019-04-14: 15:00:00 1000 mL via INTRAVENOUS

## 2019-04-14 MED ORDER — CALCIUM CARBONATE ANTACID 500 MG PO CHEW: 2.0000 | CHEWABLE_TABLET | ORAL | Status: DC | PRN

## 2019-04-14 MED ORDER — ACETAMINOPHEN 325 MG PO TABS
650.0000 mg | ORAL_TABLET | ORAL | Status: DC | PRN
  Administered 2019-04-14 – 2019-04-15 (×2): 650 mg via ORAL
  Filled 2019-04-14 (×2): qty 2

## 2019-04-14 MED ORDER — NIFEDIPINE 10 MG PO CAPS
10.0000 mg | ORAL_CAPSULE | Freq: Four times a day (QID) | ORAL | Status: DC
  Administered 2019-04-14 – 2019-04-15 (×4): 10 mg via ORAL
  Filled 2019-04-14 (×4): qty 1

## 2019-04-14 MED ORDER — BETAMETHASONE SOD PHOS & ACET 6 (3-3) MG/ML IJ SUSP
12.0000 mg | INTRAMUSCULAR | Status: AC
  Administered 2019-04-14 – 2019-04-15 (×2): 12 mg via INTRAMUSCULAR
  Filled 2019-04-14 (×2): qty 5

## 2019-04-14 MED ORDER — PRENATAL MULTIVITAMIN CH
1.0000 | ORAL_TABLET | Freq: Every day | ORAL | Status: DC
  Filled 2019-04-14: qty 1

## 2019-04-14 MED ORDER — DOCUSATE SODIUM 100 MG PO CAPS: 100.0000 mg | ORAL_CAPSULE | Freq: Every day | ORAL | Status: DC

## 2019-04-14 MED ORDER — ZOLPIDEM TARTRATE 5 MG PO TABS: 5.0000 mg | ORAL_TABLET | Freq: Every evening | ORAL | Status: DC | PRN

## 2019-04-14 NOTE — MAU Note (Signed)
Denies symptoms. covid swab collected 

## 2019-04-14 NOTE — H&P (Signed)
OB ADMISSION/ HISTORY & PHYSICAL:  Admission Date: 04/14/2019  1:08 PM  Admit Diagnosis: Shortened cervical length and threatened preterm labor  Tammie Frederick is a 39 y.o. female G2P0010 [redacted]w[redacted]d presenting as a direct admit form [facility]. Pt reported dark reddish-brownish vaginal discharge on 04/14/19 to the after hours CNM. Follow-up in office this AM, and was noted have an ~ 2cm decrease in cervical length w/ bleeding in the vaginal vault. Hx of SAB 09/2018 and cold knife cone biopsy 2011. Admitted for observation, fetal monitoring, antenatal steroids, MgSO4 for neuroprophylaxis PRN ctx. Currently denies bleeding and LOF, endorses active FM. Abd palpates soft, fetal movement noted.  History of current pregnancy: G2P0010   Patient entered care with [facility]  at 11+2 wks.   EDC of 07/06/19 was established by LMP and congruent w/ 8+2 wk U/S.   Anatomy scan:  20+1 wks, with normal findings and anterior placenta, cervix 4.4 cm.   Antenatal testing: biweekly cervical length starting at 24 weeks Last evaluation: 28+1 wks, cervical length   Significant prenatal events:  Patient Active Problem List   Diagnosis Date Noted  . Short cervical length during pregnancy 04/14/2019  . LGSIL on Pap smear of cervix 04/14/2019  . H/O cone biopsy of cervix 03/27/2009    Prenatal Labs: ABO, Rh: --/--/O POS, O POS Performed at [facility], [place] [address], [place], [place] [address]  [phone]) Antibody: NEG (01/18 1337) Rubella:   immune RPR:   non-reactive HBsAg:   negative HIV:   non-reactive GTT: passed 1 hr GBS:   unknown GC/CHL: negative/negative Genetics: low-risk female Tdap/influeza vaccine: declined/ declined   OB History  Gravida Para Term Preterm AB Living  2 0 0 0 1 0  SAB TAB Ectopic Multiple Live Births  1 0 0 0 0    # Outcome Date GA Lbr Len/2nd Weight Sex Delivery Anes PTL Lv  2 Current           1 SAB             Medical / Surgical History: Past medical history:  Past Medical  History:  Diagnosis Date  . Back injury   . Broken finger   . H/O sinus surgery   . History of back surgery     Past surgical history:  Past Surgical History:  Procedure Laterality Date  . BACK SURGERY     Family History: History reviewed. No pertinent family history.  Social History:  reports that she has never smoked. She has never used smokeless tobacco. She reports previous alcohol use. She reports that she does not use drugs.  Allergies: Sulfa antibiotics, Cauliflower [brassica oleracea], Latex, Zithromax [azithromycin], and Zyrtec [cetirizine]   Current Medications at time of admission:  Prior to Admission medications   Medication Sig Start Date End Date Taking? Authorizing Provider  HYDROcodone-acetaminophen (NORCO/VICODIN) 5-325 MG tablet Take 1-2 tablets by mouth every 6 (six) hours as needed for moderate pain or severe pain. 09/30/18   Julianne Handler, CNM    Review of Systems: Constitutional: Negative   HENT: Negative   Eyes: Negative   Respiratory: Negative   Cardiovascular: Negative   Gastrointestinal: Negative  Genitourinary: positive for dark red discharge on 04/13/19, negative for LOF   Musculoskeletal: Negative   Skin: Negative   Neurological: Negative   Endo/Heme/Allergies: Negative   Psychiatric/Behavioral: Negative    Physical Exam: VS: Blood pressure 122/70, pulse 91, temperature 98.1 F (36.7 C), temperature source Oral, resp. rate 18, height 5\' 6"  (  1.676 m), weight 83 kg, last menstrual period 07/16/2018, SpO2 100 %, unknown if currently breastfeeding. AAO x3, no signs of distress Cardiovascular: RRR Respiratory: Lung fields clear to ausculation GU/GI: Abdomen gravid, non-tender, non-distended, active FM Extremities: trace edema, negative for pain, tenderness, and cords  Cervical exam: deferred FHR: baseline rate 145 / variability moderate / accelerations present / absent decelerations TOCO: irritability noted  Prenatal Transfer Tool  Maternal  Diabetes: No Genetic Screening: Normal Maternal Ultrasounds/Referrals: Normal Fetal Ultrasounds or other Referrals:  None Maternal Substance Abuse:  No Significant Maternal Medications:  None Significant Maternal Lab Results: Other: GBS unknown    Assessment: 39 y.o. G2P0010 [redacted]w[redacted]d   Shortening cervical length Threatened preterm labor Hx of cone biopsy FHR category 1, appropriate for gest age    Plan:  23 hr Antenatal Obs Antenatal admission orders Begin betamethasone Tocolysis and Neurophylaxis w/ MgSO4 if contracting   Dr Mora Appl aware of admission and plan, Dr. Normand Sloop to be notified of admission and plan of care  Roma Schanz CNM, MSN 04/14/2019 2:33 PM

## 2019-04-14 NOTE — Plan of Care (Signed)
  Problem: Clinical Measurements: Goal: Complications related to the disease process, condition or treatment will be avoided or minimized Outcome: Progressing  Pt. To start on procarida this PM. Pt. Will call with changes in conditions. GBS collected this PM shift.

## 2019-04-14 NOTE — Progress Notes (Signed)
Pt. With self description of muscle pain in LUQ. Sp02 97%, LCTA bilateral. Gas heard in area. Pt. Requests flexeril. Lizbeth Bark, CMN called.

## 2019-04-15 ENCOUNTER — Observation Stay (HOSPITAL_BASED_OUTPATIENT_CLINIC_OR_DEPARTMENT_OTHER): Payer: 59

## 2019-04-15 DIAGNOSIS — Z3A28 28 weeks gestation of pregnancy: Secondary | ICD-10-CM

## 2019-04-15 DIAGNOSIS — O26873 Cervical shortening, third trimester: Secondary | ICD-10-CM | POA: Diagnosis not present

## 2019-04-15 DIAGNOSIS — M62838 Other muscle spasm: Secondary | ICD-10-CM | POA: Diagnosis present

## 2019-04-15 DIAGNOSIS — O4700 False labor before 37 completed weeks of gestation, unspecified trimester: Secondary | ICD-10-CM | POA: Diagnosis present

## 2019-04-15 DIAGNOSIS — O09523 Supervision of elderly multigravida, third trimester: Secondary | ICD-10-CM

## 2019-04-15 DIAGNOSIS — O365990 Maternal care for other known or suspected poor fetal growth, unspecified trimester, not applicable or unspecified: Secondary | ICD-10-CM

## 2019-04-15 HISTORY — DX: False labor before 37 completed weeks of gestation, unspecified trimester: O47.00

## 2019-04-15 HISTORY — DX: Maternal care for other known or suspected poor fetal growth, unspecified trimester, not applicable or unspecified: O36.5990

## 2019-04-15 IMAGING — US US MFM OB TRANSVAGINAL
1 series · 15 of 28 positions shown · non-contrast
Comparison: none

[Series 1: us mfm ob transvaginal · 15 of 32 slices shown]
[im 1/32]
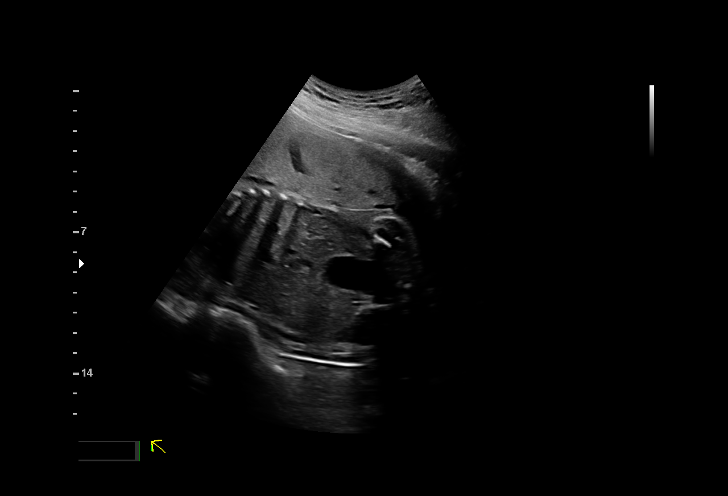
[im 3/32]
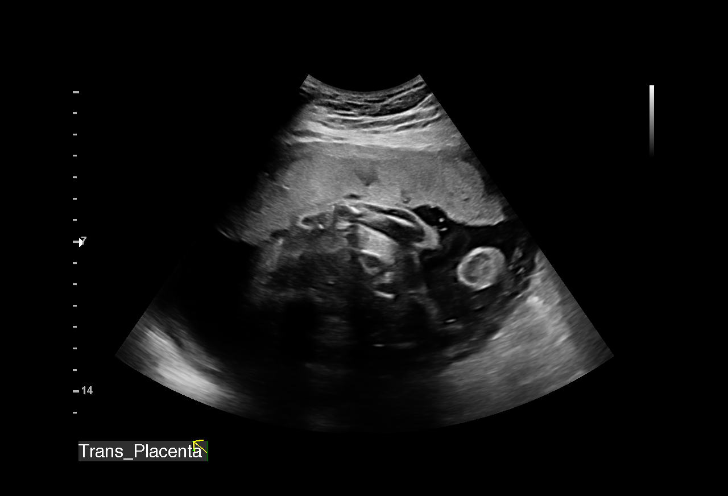
[im 5/32]
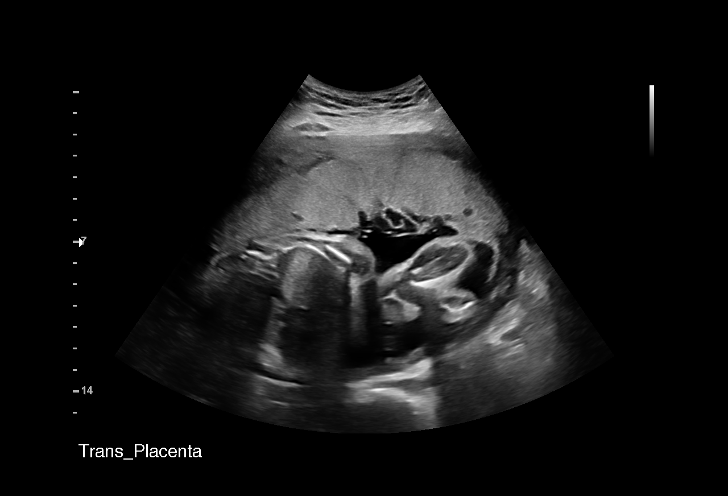
[im 7/32]
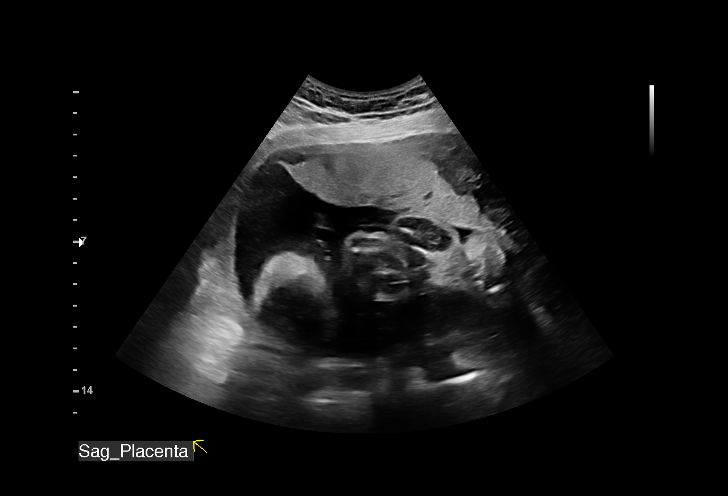
[im 10/32]
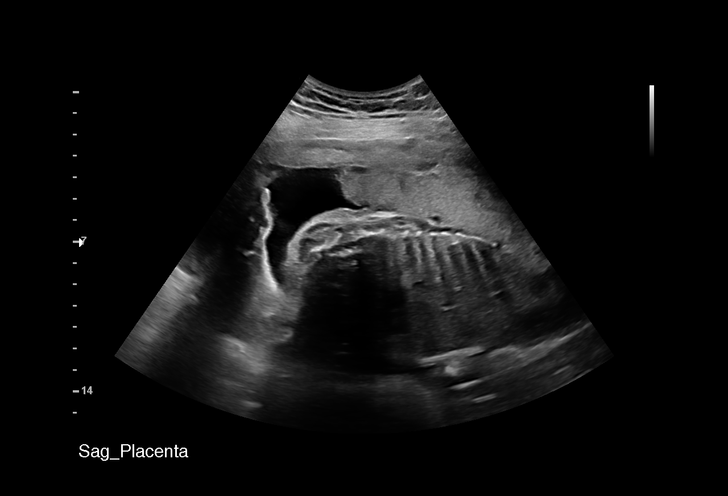
[im 12/32]
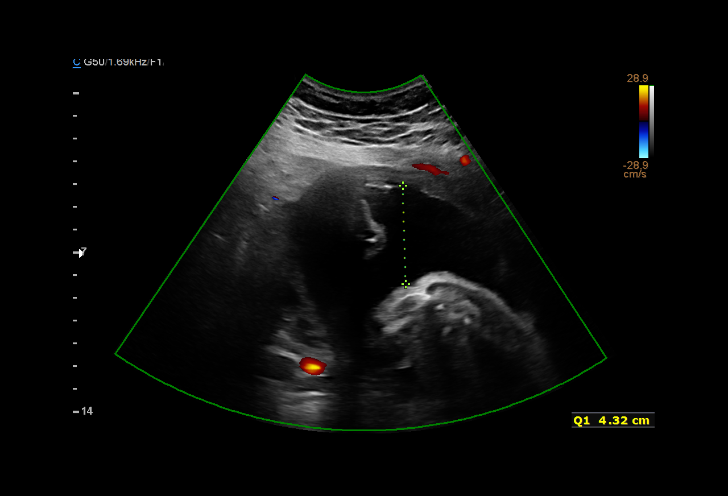
[im 14/32]
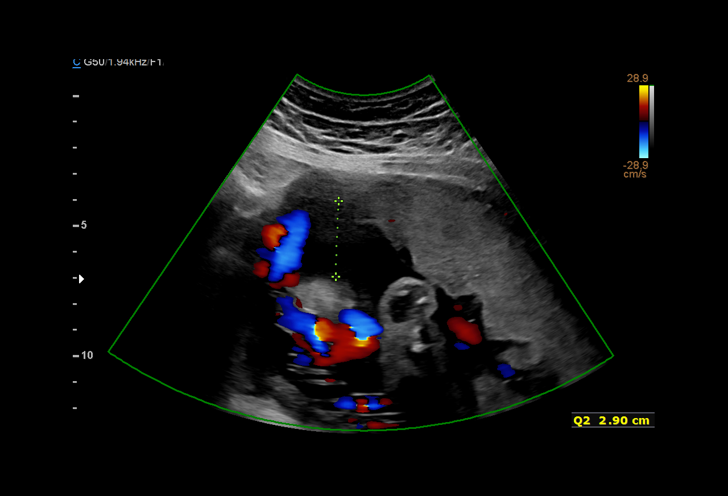
[im 17/32]
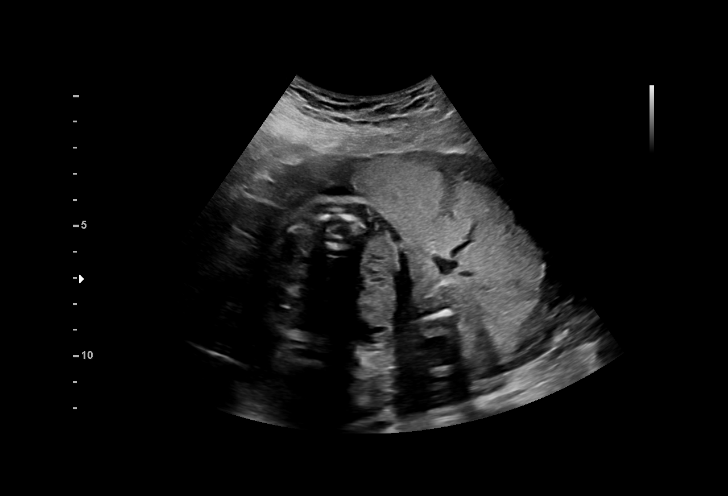
[im 18/32]
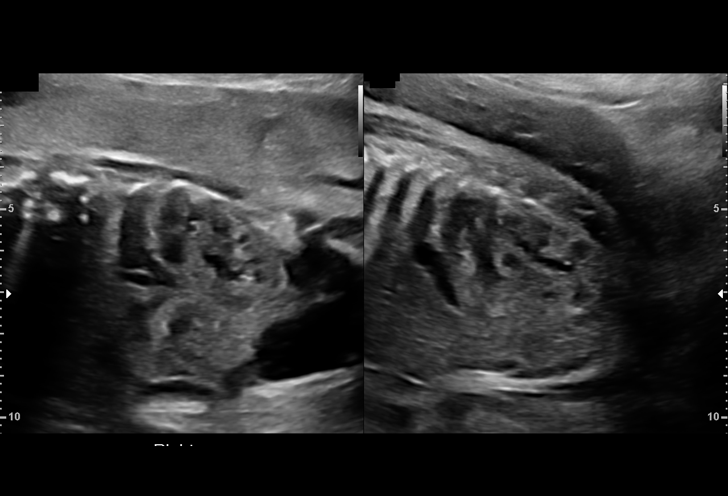
[im 20/32]
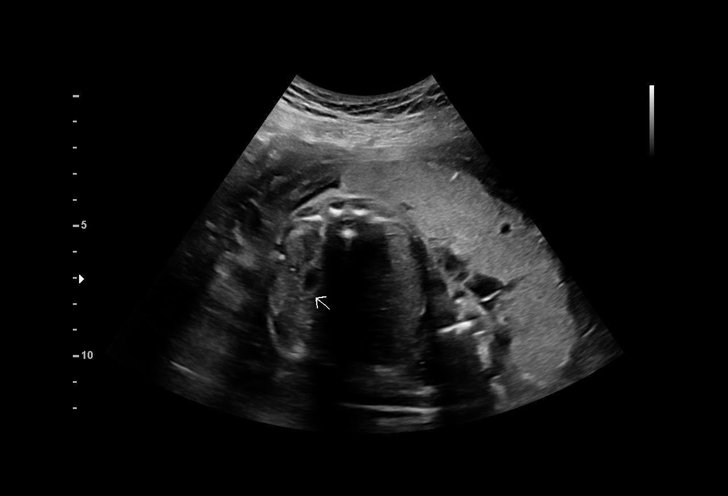
[im 22/32]
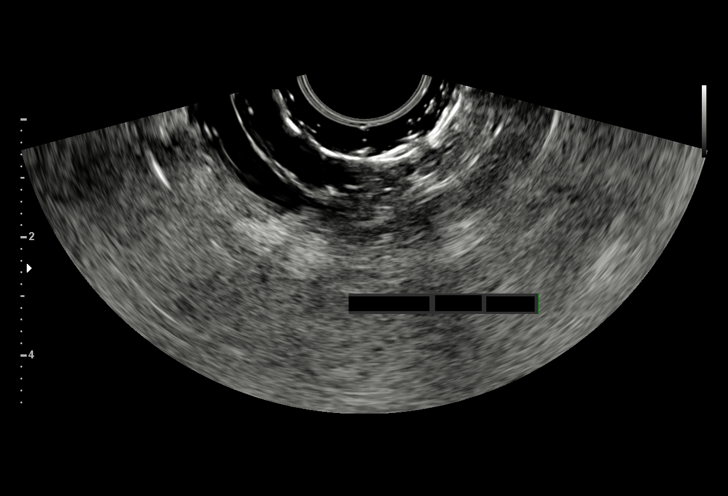
[im 25/32]
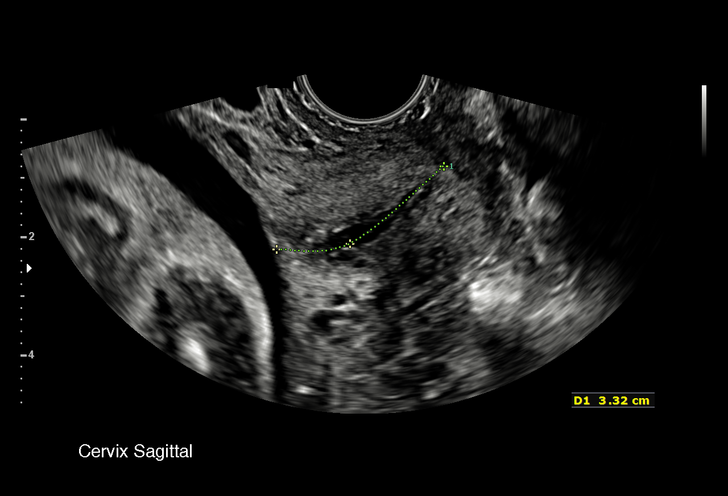
[im 27/32]
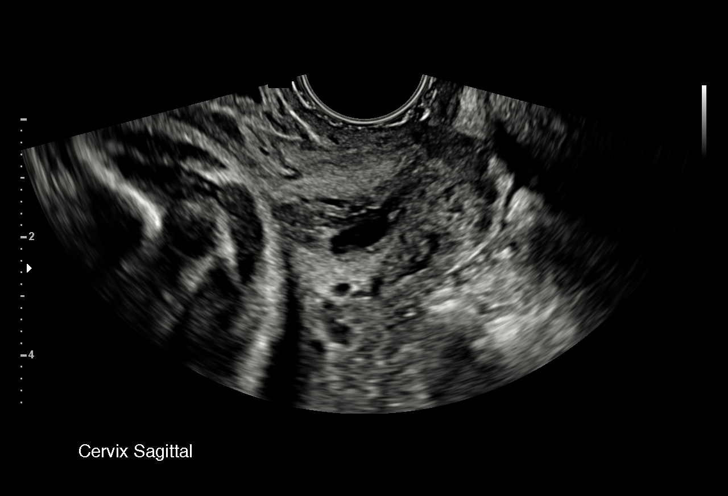
[im 29/32]
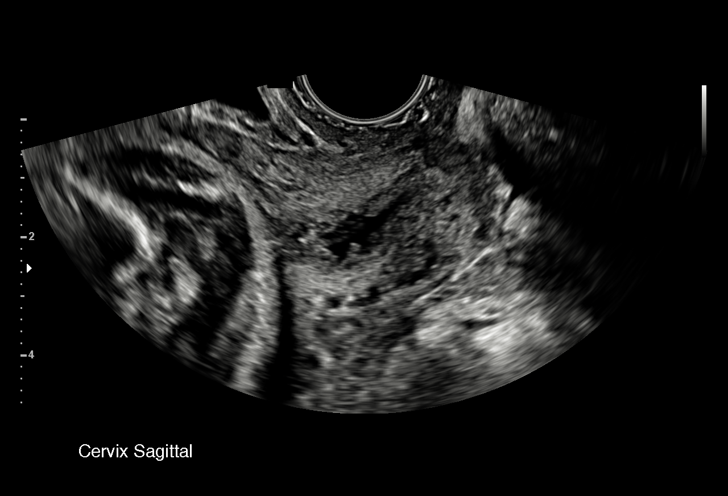
[im 32/32]
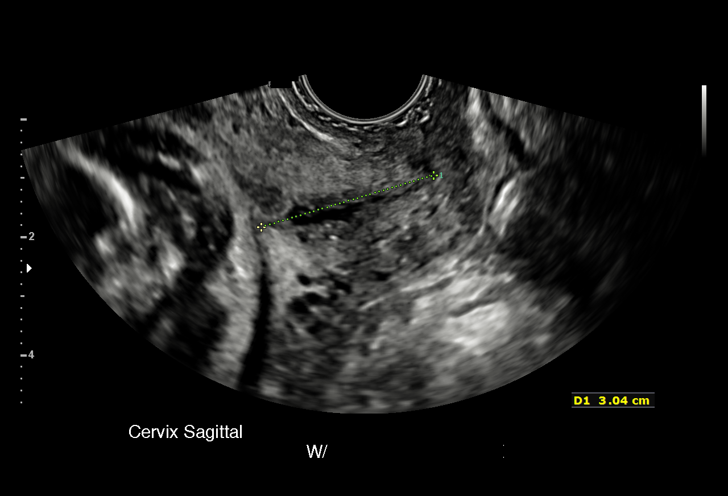

[15 of 28 positions shown; findings below may reference images not displayed]

Attending:        AUDE      Secondary Phy.:   [facility] MAU/Triage
 Referred By:      AUDE           [facility]

  1  US MFM OB TRANSVAGINAL               76817.2      AUDE
 ----------------------------------------------------------------------

 ----------------------------------------------------------------------
Indications

  Encounter for cervical length                  [E0]
  28 weeks gestation of pregnancy
  Cervical shortening, third trimester           [E0]
  Advanced maternal age multigravida 35+,        [E0]
  third trimester
 ----------------------------------------------------------------------
Fetal Evaluation

 Num Of Fetuses:         1
 Fetal Heart Rate(bpm):  138
 Cardiac Activity:       Observed
 Presentation:           Breech
 Placenta:               Anterior

 Amniotic Fluid
 AFI FV:      Within normal limits

 AFI Sum(cm)     %Tile       Largest Pocket(cm)
 10.6            16

 RUQ(cm)       RLQ(cm)       LUQ(cm)        LLQ(cm)

OB History

 Gravidity:    2          SAB:   1
Gestational Age

 LMP:           28w 2d        Date:  [DATE]                 EDD:   [DATE]
 Best:          28w 2d     Det. By:  LMP  ([DATE])          EDD:   [DATE]
Anatomy

 Stomach:               Appears normal, left   Bladder:                Appears normal
                        sided
 Kidneys:               Appear normal
Cervix Uterus Adnexa

 Cervix
 Length:            2.9  cm.
 Measured transvaginally. Appears closed, without funnelling.
Impression

 Limited exam for cervical length
 The cervical length was normal 2.9 cm
 Please previous note regarding recommendation.
Recommendations

 No further cervical lengths recommended
 Clinical correlation recommended.

## 2019-04-15 MED ORDER — CYCLOBENZAPRINE HCL 5 MG PO TABS: 5.0000 mg | ORAL_TABLET | Freq: Three times a day (TID) | ORAL | 0 refills | Status: DC | PRN

## 2019-04-15 MED ORDER — NIFEDIPINE 10 MG PO CAPS: 10.0000 mg | ORAL_CAPSULE | Freq: Four times a day (QID) | ORAL | 0 refills | Status: DC

## 2019-04-15 NOTE — Discharge Summary (Signed)
Antepartum OB Discharge Summary     Patient Name: Tammie Frederick DOB: 08-May-1980 MRN: [id number]  Date of admission: 04/14/2019 Type of discharge: 04/15/19   Admitting diagnosis: Short cervical length during pregnancy [O26.879] Intrauterine pregnancy: [redacted]w[redacted]d     Secondary diagnosis:  Active Problems:   Short cervical length during pregnancy   LGSIL on Pap smear of cervix   H/O cone biopsy of cervix   Muscle spasm   Threatened preterm labor   IUGR (intrauterine growth restriction) affecting care of mother                               History of Present Illness: Tammie Frederick is a 39 y.o. female, G2P0010, who presents at [redacted]w[redacted]d weeks gestation. The patient has been followed at  [facility]  Her pregnancy has been complicated by:  Patient Active Problem List   Diagnosis Date Noted  . Muscle spasm 04/15/2019  . Threatened preterm labor 04/15/2019  . IUGR (intrauterine growth restriction) affecting care of mother 04/15/2019  . Short cervical length during pregnancy 04/14/2019  . LGSIL on Pap smear of cervix 04/14/2019  . H/O cone biopsy of cervix 03/27/2009    Hospital course:  Per H&P Tammie Frederick is a 39 y.o. female G2P0010 [redacted]w[redacted]d presenting as a direct admit form [facility]. Pt reported dark reddish-brownish vaginal discharge on 04/14/19 to the after hours CNM. Follow-up in office this AM, and was noted have an ~ 2cm decrease in cervical length w/ bleeding in the vaginal vault. Hx of SAB 09/2018 and cold knife cone biopsy 2011. Admitted for observation, fetal monitoring, antenatal steroids, MgSO4 for neuroprophylaxis PRN ctx. Currently denies bleeding and LOF, endorses active FM. Abd palpates soft, fetal movement noted.  Today: Pt being discharge to home in stable condition, denies cxt now, endorses having various muscle spasms and request flexeril to take as needed at home, pt reviewed risk and benefit of medication during pregnancy pt denies nay  questions and request flexeril, also pt to be sent home with procardia to take PRN for cxt, pt to report preterm cxt or leakage or vaginal bleeding, Per Dr Estanislado Pandy no vaginal progesterone indicated at this time, f/u with [facility] for weekly BPP, dopplers and every 2 weeks growth scan due to new found IUGR 9%. BMZ is to be given 2nd dose prior to discharge at 1230 today, US showed EFW was 2.4lbs, AFI 17, Breech. NST reactive. Cervical noted on [place] to be between 2.9cm-3.2cm. Dr Estanislado Pandy recommended discharge today. Pt currently denies leakage of fluid, no vaginal bleeding, and   Physical exam  Vitals:   04/14/19 2007 04/15/19 0040 04/15/19 0517 04/15/19 0730  BP: 124/70 115/63 111/64 108/62  Pulse: (!) 101 90 85 83  Resp: 18 18 18 16   Temp: 98.2 F (36.8 C) 98.1 F (36.7 C) 98.2 F (36.8 C) 98 F (36.7 C)  TempSrc: Oral  Oral Oral  SpO2: 99% 98% 99% 98%  Weight:      Height:       General: alert, cooperative and no distress Lochia: None Uterus: Soft non-tender, gravida  Perineum: Intact DVT Evaluation: No evidence of DVT seen on physical exam. Negative Homan's sign. No cords or calf tenderness. No significant calf/ankle edema.  Labs: Lab Results  Component Value Date   WBC 13.1 (H) 04/14/2019   HGB 12.2 04/14/2019   HCT 36.2 04/14/2019   MCV 89.6 04/14/2019   PLT  409 (H) 04/14/2019   No flowsheet data found.  Date of discharge: 04/15/2019 Discharge Diagnoses: Threatened PTL with short cervical length  Discharge instruction: per After Visit Summary and "Baby and Me Booklet".  After visit meds:   Activity:           pelvic rest Advance as tolerated. Pelvic rest for 6 weeks.  Diet:                routine Medications: PNV and procardia for cxt, and flexeril for muscle pain  Condition:  Pt discharge to home in stable condition   Meds: Allergies as of 04/15/2019      Reactions   Sulfa Antibiotics Anaphylaxis   Stomach pain   Cauliflower [brassica Oleracea]    Latex Swelling    Zithromax [azithromycin]    Stomach pains   Zyrtec [cetirizine] Hives      Medication List    STOP taking these medications   HYDROcodone-acetaminophen 5-325 MG tablet Commonly known as: NORCO/VICODIN     TAKE these medications   cyclobenzaprine 5 MG tablet Commonly known as: FLEXERIL Take 1 tablet (5 mg total) by mouth 3 (three) times daily as needed for muscle spasms.   NIFEdipine 10 MG capsule Commonly known as: PROCARDIA Take 1 capsule (10 mg total) by mouth every 6 (six) hours.       Discharge Follow Up:  Follow-up [facility]. Schedule an appointment as soon as possible for a visit in 2 week(s).   Specialty: Obstetrics and Gynecology Why: Next ROB and 1 week BPP, Doppler, with every 2 weeks Growth scan  Contact information: [address] [place] [id number] [place], NP-C, CNM 04/15/2019, 9:14 AM  Noralyn Pick, [place]

## 2019-04-15 NOTE — Progress Notes (Signed)
Discharge reviewed with patient. Pt. Knows to schedule her own follow up appt. With [facility] and verbalized she would today. Reviewed PTL instructions, fetal kick counts, and paperwork regarding all pregnancy discharge instructions. Pt. Verbalized understanding.  2nd dose of BMZ provided.  Procardia & Flexeril called in to [facility]

## 2019-05-12 ENCOUNTER — Inpatient Hospital Stay (HOSPITAL_COMMUNITY)
Admission: AD | Admit: 2019-05-12 | Discharge: 2019-05-12 | Disposition: A | Discharge status: Home / Self Care | Payer: 59 | Attending: Obstetrics and Gynecology | Admitting: Obstetrics and Gynecology

## 2019-05-12 ENCOUNTER — Encounter (HOSPITAL_COMMUNITY): Payer: Self-pay | Admitting: Obstetrics and Gynecology

## 2019-05-12 ENCOUNTER — Other Ambulatory Visit: Payer: Self-pay

## 2019-05-12 ENCOUNTER — Inpatient Hospital Stay (HOSPITAL_BASED_OUTPATIENT_CLINIC_OR_DEPARTMENT_OTHER): Payer: 59

## 2019-05-12 DIAGNOSIS — Z91040 Latex allergy status: Secondary | ICD-10-CM | POA: Diagnosis not present

## 2019-05-12 DIAGNOSIS — O09523 Supervision of elderly multigravida, third trimester: Secondary | ICD-10-CM | POA: Diagnosis not present

## 2019-05-12 DIAGNOSIS — R109 Unspecified abdominal pain: Secondary | ICD-10-CM | POA: Diagnosis not present

## 2019-05-12 DIAGNOSIS — Z882 Allergy status to sulfonamides status: Secondary | ICD-10-CM | POA: Insufficient documentation

## 2019-05-12 DIAGNOSIS — Z91018 Allergy to other foods: Secondary | ICD-10-CM | POA: Diagnosis not present

## 2019-05-12 DIAGNOSIS — Z3A32 32 weeks gestation of pregnancy: Secondary | ICD-10-CM

## 2019-05-12 DIAGNOSIS — O4703 False labor before 37 completed weeks of gestation, third trimester: Secondary | ICD-10-CM | POA: Diagnosis present

## 2019-05-12 DIAGNOSIS — O26899 Other specified pregnancy related conditions, unspecified trimester: Secondary | ICD-10-CM

## 2019-05-12 DIAGNOSIS — O26893 Other specified pregnancy related conditions, third trimester: Secondary | ICD-10-CM

## 2019-05-12 DIAGNOSIS — Z881 Allergy status to other antibiotic agents status: Secondary | ICD-10-CM | POA: Diagnosis not present

## 2019-05-12 LAB — WET PREP, GENITAL
Clue Cells Wet Prep HPF POC: NONE SEEN
Sperm: NONE SEEN
Trich, Wet Prep: NONE SEEN
Yeast Wet Prep HPF POC: NONE SEEN

## 2019-05-12 LAB — CBC WITH DIFFERENTIAL/PLATELET
Abs Immature Granulocytes: 0.22 10*3/uL — ABNORMAL HIGH (ref 0.00–0.07)
Basophils Absolute: 0.1 10*3/uL (ref 0.0–0.1)
Basophils Relative: 0 %
Eosinophils Absolute: 0.1 10*3/uL (ref 0.0–0.5)
Eosinophils Relative: 0 %
HCT: 34.8 % — ABNORMAL LOW (ref 36.0–46.0)
Hemoglobin: 11.7 g/dL — ABNORMAL LOW (ref 12.0–15.0)
Immature Granulocytes: 2 %
Lymphocytes Relative: 15 %
Lymphs Abs: 2 10*3/uL (ref 0.7–4.0)
MCH: 29.6 pg (ref 26.0–34.0)
MCHC: 33.6 g/dL (ref 30.0–36.0)
MCV: 88.1 fL (ref 80.0–100.0)
Monocytes Absolute: 0.8 10*3/uL (ref 0.1–1.0)
Monocytes Relative: 6 %
Neutro Abs: 10.1 10*3/uL — ABNORMAL HIGH (ref 1.7–7.7)
Neutrophils Relative %: 77 %
Platelets: 333 10*3/uL (ref 150–400)
RBC: 3.95 MIL/uL (ref 3.87–5.11)
RDW: 13.5 % (ref 11.5–15.5)
WBC: 13.2 10*3/uL — ABNORMAL HIGH (ref 4.0–10.5)
nRBC: 0 % (ref 0.0–0.2)

## 2019-05-12 LAB — URINALYSIS, ROUTINE W REFLEX MICROSCOPIC
Bilirubin Urine: NEGATIVE
Glucose, UA: NEGATIVE mg/dL
Hgb urine dipstick: NEGATIVE
Ketones, ur: NEGATIVE mg/dL
Leukocytes,Ua: NEGATIVE
Nitrite: NEGATIVE
Protein, ur: NEGATIVE mg/dL
Specific Gravity, Urine: 1.013 (ref 1.005–1.030)
pH: 6 (ref 5.0–8.0)

## 2019-05-12 LAB — FETAL FIBRONECTIN: Fetal Fibronectin: NEGATIVE

## 2019-05-12 IMAGING — US US MFM OB LIMITED
1 series · 14 of 28 positions shown · non-contrast
Comparison: none

[Series 1: us mfm ob limited · 14 of 54 slices shown]
[im 2/54]
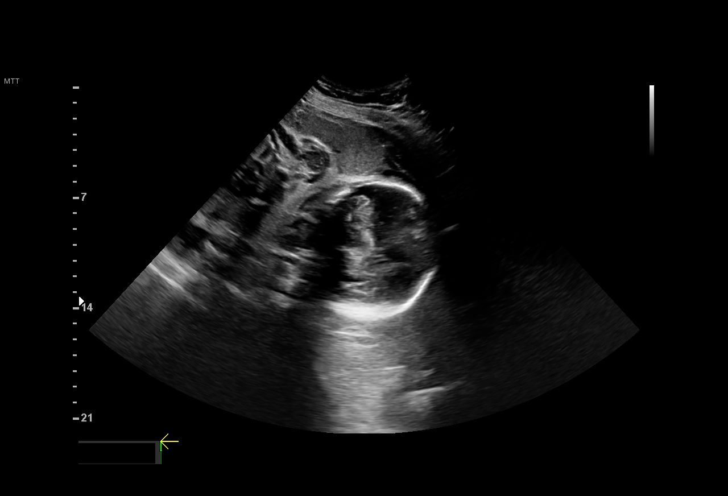
[im 6/54]
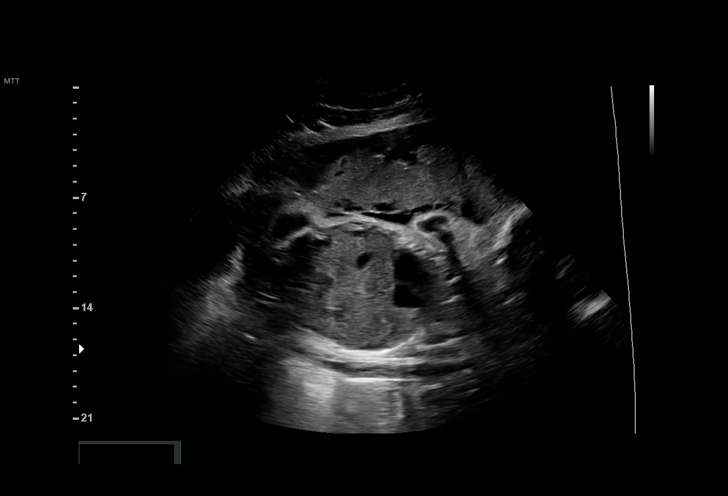
[im 10/54]
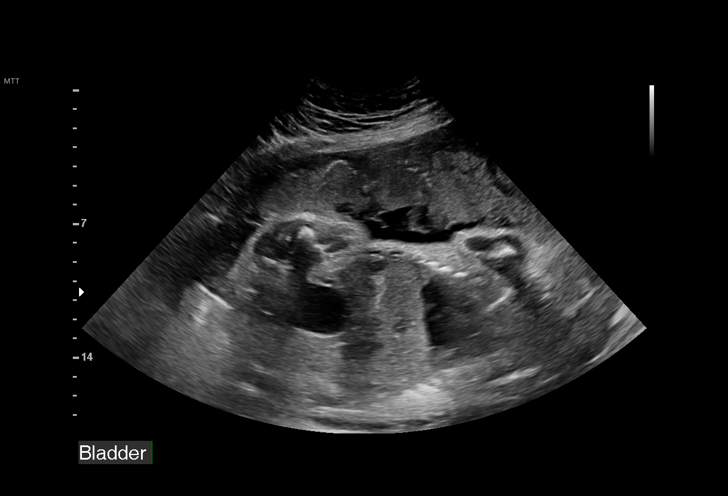
[im 14/54]
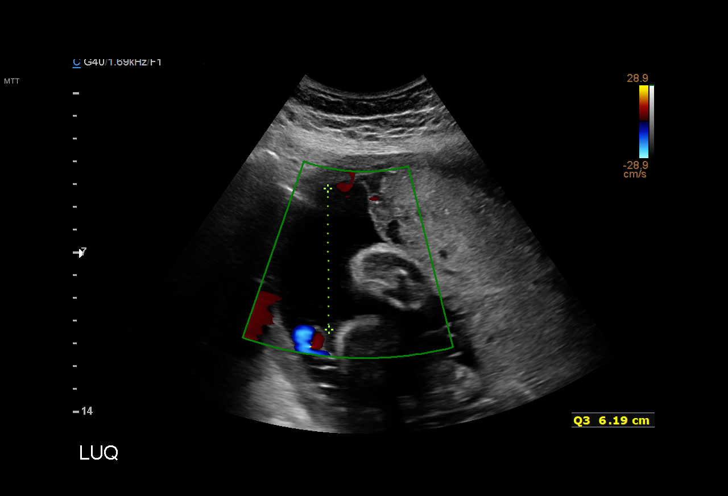
[im 18/54]
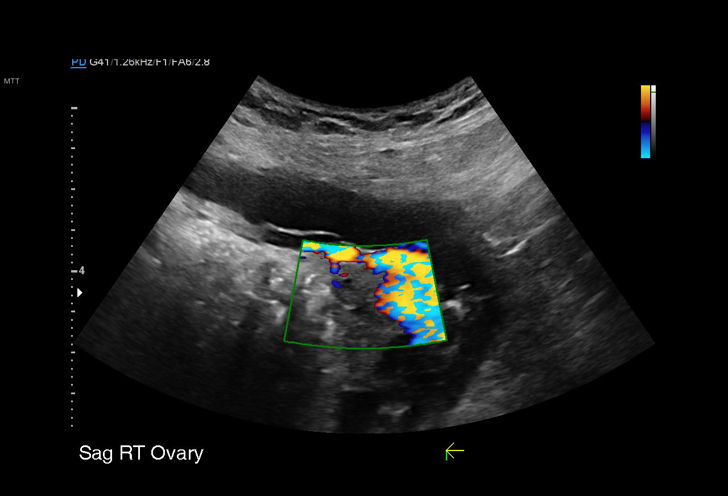
[im 22/54]
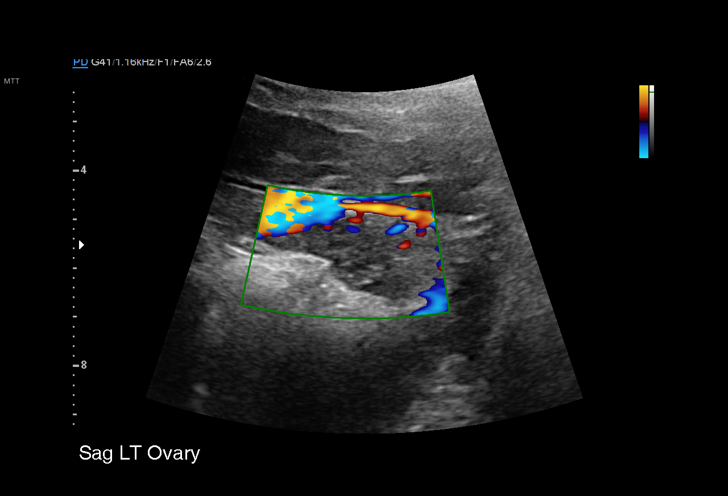
[im 26/54]
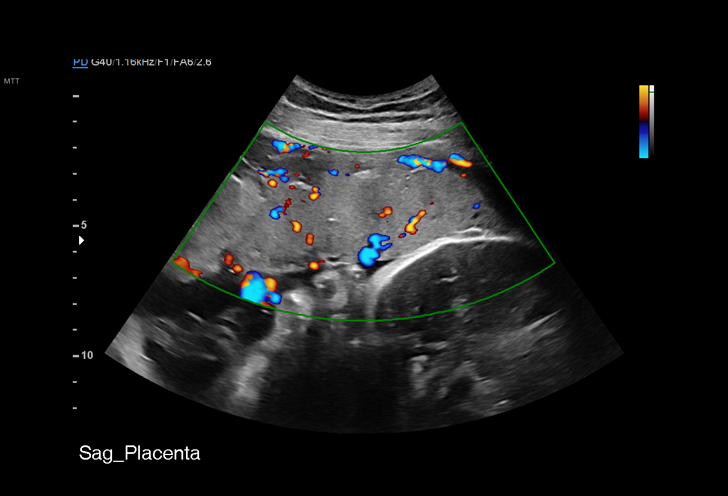
[im 30/54]
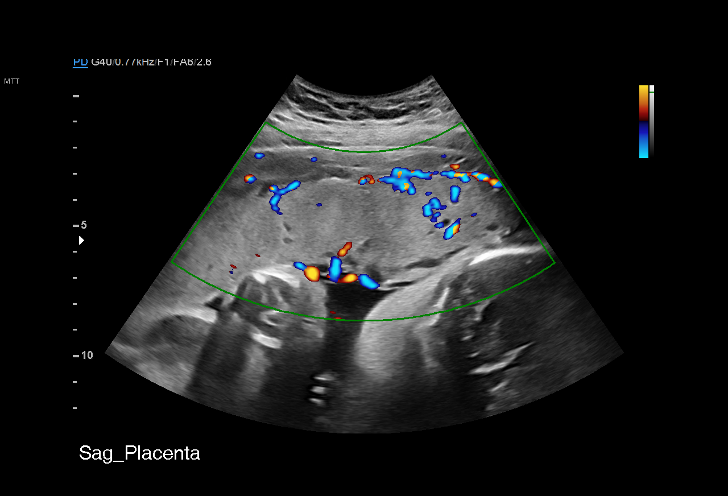
[im 34/54]
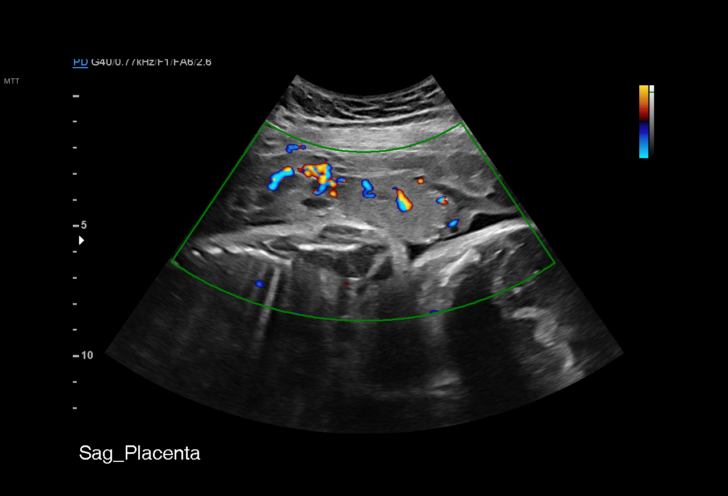
[im 38/54]
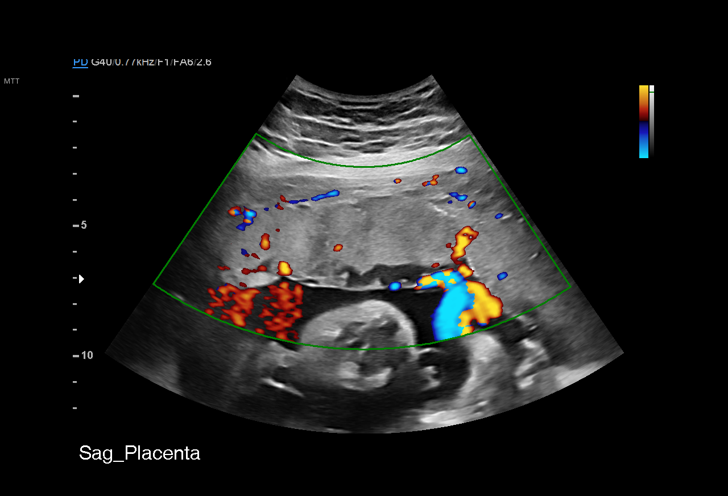
[im 42/54]
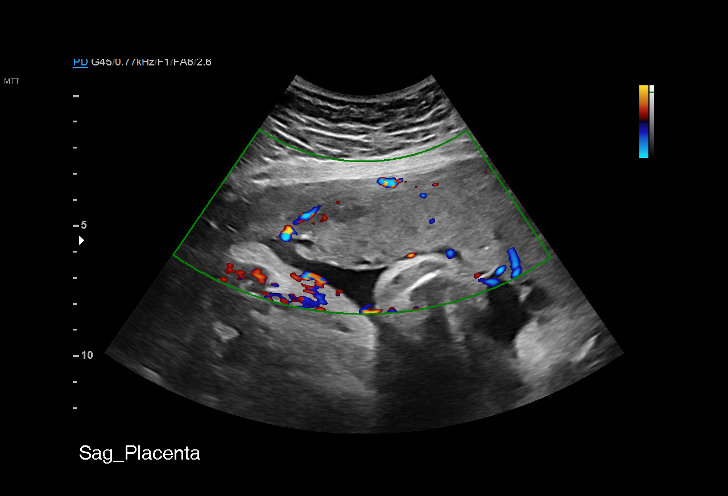
[im 46/54]
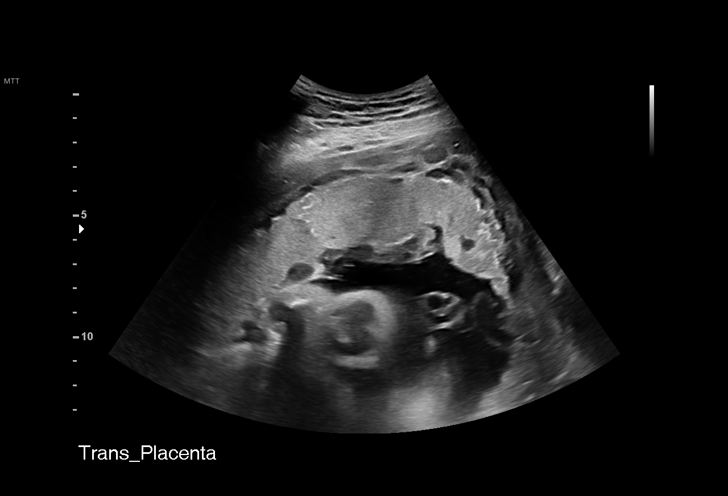
[im 50/54]
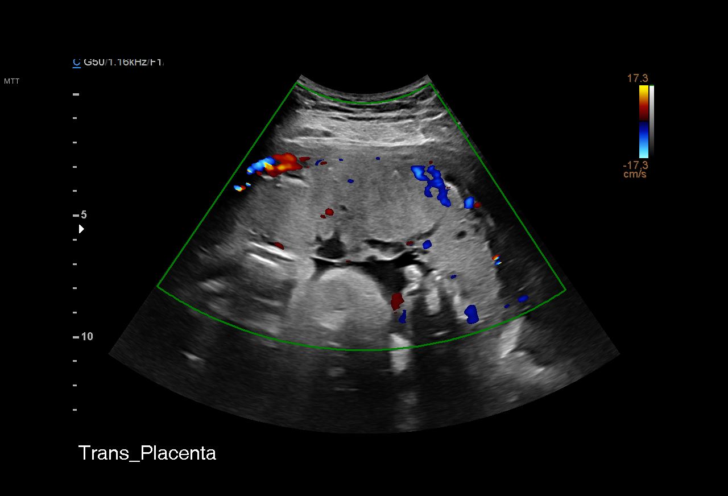
[im 54/54]
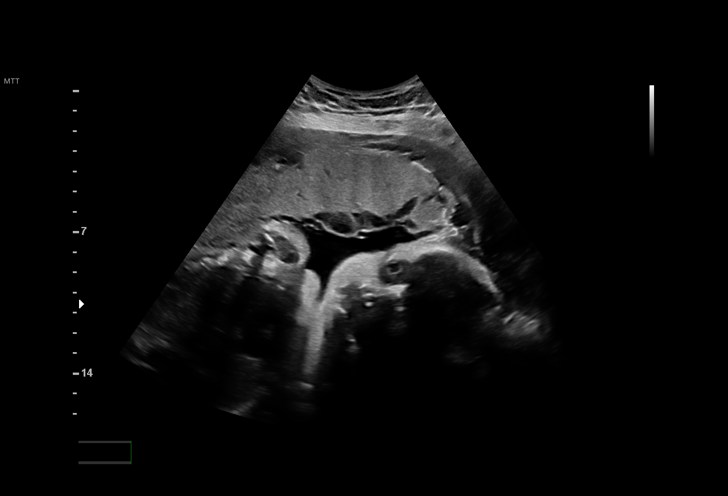

[14 of 28 positions shown; findings below may reference images not displayed]

1  US MFM OB LIMITED                    76815.01     JHOREE
 ----------------------------------------------------------------------

 ----------------------------------------------------------------------
Indications

  Abdominal pain in pregnancy                    [8S]
  32 weeks gestation of pregnancy
 ----------------------------------------------------------------------
Fetal Evaluation

 Num Of Fetuses:         1
 Fetal Heart Rate(bpm):  142
 Cardiac Activity:       Observed
 Presentation:           Cephalic
 Placenta:               Anterior
 P. Cord Insertion:      Previously Visualized

 Amniotic Fluid
 AFI FV:      Within normal limits

 AFI Sum(cm)     %Tile       Largest Pocket(cm)
 16.89           61

 RUQ(cm)       RLQ(cm)       LUQ(cm)        LLQ(cm)


 Comment:    No placental abruption or previa identified.
Biometry

 LV:        6.1  mm
OB History

 Gravidity:    2          SAB:   1
Gestational Age
 LMP:           32w 1d        Date:  [DATE]                 EDD:   [DATE]
 Best:          32w 1d     Det. By:  LMP  ([DATE])          EDD:   [DATE]
Anatomy

 Cranium:               Previously seen        Aortic Arch:            Previously seen
 Cavum:                 Previously seen        Ductal Arch:            Not well visualized
 Ventricles:            Appears normal         Diaphragm:              Appears normal
 Choroid Plexus:        Not well visualized    Stomach:                Appears normal, left
                                                                       sided
 Cerebellum:            Previously seen        Abdomen:                Previously seen
 Posterior Fossa:       Not well visualized    Abdominal Wall:         Appears nml (cord
                                                                       insert, abd wall)
 Nuchal Fold:           Not applicable (>20    Cord Vessels:           Previously seen
                        wks GA)
 Face:                  Orbits nl prev.;       Kidneys:                Appear normal
                        profile not well vis.
 Lips:                  Previously seen        Bladder:                Appears normal
 Thoracic:              Previously seen        Spine:                  Limited views
                                                                       previously seen
 Heart:                 Previously seen        Upper Extremities:      Prev. Visualized
 RVOT:                  Not well visualized    Lower Extremities:      Previously seen
 LVOT:                  Not well visualized

 Other:  Technically difficult due to advanced gestational age. Technically
         difficult due to fetal position.
Cervix Uterus Adnexa

 Cervix
 Not visualized (advanced GA >[8S])

 Uterus
 No abnormality visualized.

 Left Ovary
 Size(cm)     2.28   x   1.12   x  1.63      Vol(ml):
 Within normal limits. No adnexal mass visualized.

 Right Ovary
 Size(cm)     1.63   x   1.07   x  1.25      Vol(ml):
 Within normal limits. No adnexal mass visualized.

 Cul De Sac
 No free fluid seen.

 Adnexa
 No abnormality visualized. No free fluid.
Comments

 A limited ultrasound was performed today due to abdominal
 pain and pregnancy.

 A singleton viable intrauterine pregnancy with normal
 amniotic fluid was noted.

 There were no signs of retroplacental clots noted on the
 anterior placenta.

## 2019-05-12 MED ORDER — NIFEDIPINE 10 MG PO CAPS: 10.0000 mg | ORAL_CAPSULE | Freq: Once | ORAL | Status: DC

## 2019-05-12 MED ORDER — OXYCODONE HCL 5 MG PO TABS
5.0000 mg | ORAL_TABLET | Freq: Once | ORAL | Status: AC
  Administered 2019-05-12: 06:00:00 5 mg via ORAL
  Filled 2019-05-12: qty 1

## 2019-05-12 MED ORDER — NIFEDIPINE 10 MG PO CAPS
10.0000 mg | ORAL_CAPSULE | ORAL | Status: AC | PRN
  Administered 2019-05-12 (×3): 10 mg via ORAL
  Filled 2019-05-12 (×3): qty 1

## 2019-05-12 MED ORDER — LACTATED RINGERS IV BOLUS: 1000.0000 mL | Freq: Once | INTRAVENOUS | Status: DC

## 2019-05-12 MED ORDER — NIFEDIPINE 10 MG PO CAPS
10.0000 mg | ORAL_CAPSULE | Freq: Once | ORAL | Status: AC
  Administered 2019-05-12: 05:00:00 10 mg via ORAL
  Filled 2019-05-12: qty 1

## 2019-05-12 MED ORDER — NIFEDIPINE ER OSMOTIC RELEASE 30 MG PO TB24
30.0000 mg | ORAL_TABLET | Freq: Once | ORAL | Status: AC
  Administered 2019-05-12: 06:00:00 30 mg via ORAL
  Filled 2019-05-12: qty 1

## 2019-05-12 MED ORDER — LACTATED RINGERS IV BOLUS
1000.0000 mL | Freq: Once | INTRAVENOUS | Status: AC
  Administered 2019-05-12: 1000 mL via INTRAVENOUS

## 2019-05-12 MED ORDER — LACTATED RINGERS IV SOLN: Freq: Once | INTRAVENOUS | Status: AC

## 2019-05-12 MED ORDER — CYCLOBENZAPRINE HCL 5 MG PO TABS
10.0000 mg | ORAL_TABLET | Freq: Once | ORAL | Status: AC
  Administered 2019-05-12: 04:00:00 10 mg via ORAL
  Filled 2019-05-12: qty 2

## 2019-05-12 NOTE — Progress Notes (Signed)
Subjective:    Pain has not been relieved after Flexeril and Roxicodone. Pain radiates across lower abd and into back, but is most intense in LLQ and worsens w/ position change. Reports increased FM. Assisted in left and right lateral side-lying release positions to assess mobility of pelvic ligaments and joints. Reports reg BM,Tearful w/ movement, no guarding or rebounding.   Objective:    VS: BP 123/70   Pulse (!) 114   Temp (!) 97.1 F (36.2 C) (Oral)   Resp (!) 22   LMP 07/16/2018   SpO2 97%  FHR : baseline 140 / variability moderate / accelerations present / absent decelerations Toco: contractions every 2-3 minutes w/ irritability Membranes: intact Dilation: Closed Exam by:: Thalia Bloodgood, CNM  Urinalysis    Component Value Date/Time   COLORURINE YELLOW 05/12/2019 0435   APPEARANCEUR CLEAR 05/12/2019 0435   LABSPEC 1.013 05/12/2019 0435   PHURINE 6.0 05/12/2019 0435   GLUCOSEU NEGATIVE 05/12/2019 0435   HGBUR NEGATIVE 05/12/2019 0435   BILIRUBINUR NEGATIVE 05/12/2019 0435   KETONESUR NEGATIVE 05/12/2019 0435   PROTEINUR NEGATIVE 05/12/2019 0435   NITRITE NEGATIVE 05/12/2019 0435   LEUKOCYTESUR NEGATIVE 05/12/2019 0435    Gen: AAO x3, cooperative Abd: soft, gravid, tenderness in LLQ, palpable FM, negative Murphy's sign   Assessment/Plan:   39 y.o. G2P0010 [redacted]w[redacted]d  Abdominal pain-unresolved w/ pain med and muscle relaxant Cat 1  Ultrasound to eval pain     Roma Schanz CNM, MSN 05/12/2019 9:19 AM  Exam and plan reveiewd w/ Dr. Estanislado Pandy.

## 2019-05-12 NOTE — MAU Note (Signed)
Patient presents to MAU c/o severe abdominal pain that started at 0130 when getting up to go to bathroom.  Patient stated she has a hx of preterm labor and is being monitored for incompetent cervix. +FM (patient reports increased pain with every fetal movement) Denies vaginal bleeding or LOF.

## 2019-05-12 NOTE — MAU Provider Note (Signed)
History     CSN: [id number]  Arrival date & time 05/12/19  7846   First Provider Initiated Contact with Patient 05/12/19 0401      Chief Complaint  Patient presents with  . Abdominal Pain   HPI  Ronika Kelson is a 39 y.o. G2P0010 at [redacted]w[redacted]d who presents to MAU with chief complaint of preterm contractions. This is a recurrent problem, current episode began at 0130 today. She endorses bilateral lower abdominal cramping which radiates to her lower back. She was previously prescribed Procardia and Flexeril for this complaint but is out of pills. She denies vaginal bleeding, leaking of fluid, decreased fetal movement, fever, falls, or recent illness.   OB history includes history of cone knife cone biopsy in 2011. S/p antepartum admission 04/14/2019-04/15/2019 for threatened preterm labor. Evaluated in clinic 05/08/2019 and told that "everything looked normal".  Receives prenatal care with [facility]. Originally scheduled to be seen in clinic this morning (05/12/2019) but appointment canceled due to winter weather.  Past Medical History:  Diagnosis Date  . Back injury   . Broken finger   . H/O sinus surgery   . History of back surgery     Past Surgical History:  Procedure Laterality Date  . BACK SURGERY      No family history on file.  Social History   Tobacco Use  . Smoking status: Never Smoker  . Smokeless tobacco: Never Used  Substance Use Topics  . Alcohol use: Not Currently    Comment: socially  . Drug use: Never    OB History    Gravida  2   Para  0   Term  0   Preterm  0   AB  1   Living  0     SAB  1   TAB  0   Ectopic  0   Multiple  0   Live Births  0           Review of Systems  Constitutional: Negative for chills, fatigue and fever.  Respiratory: Negative for shortness of breath.   Gastrointestinal: Positive for abdominal pain.  Genitourinary: Negative for dysuria, flank pain, vaginal bleeding, vaginal discharge and vaginal pain.   Musculoskeletal: Negative for back pain.  All other systems reviewed and are negative.   Allergies  Sulfa antibiotics, Cauliflower [brassica oleracea], Latex, Zithromax [azithromycin], and Zyrtec [cetirizine]  Home Medications    BP 123/71 (BP Location: Right Arm)   Pulse 98   Temp (!) 97.1 F (36.2 C) (Oral)   Resp (!) 22   LMP 07/16/2018   SpO2 98%   Physical Exam Vitals and nursing note reviewed.  Pulmonary:     Effort: Pulmonary effort is normal.     Breath sounds: Normal breath sounds.  Abdominal:     Tenderness: There is abdominal tenderness in the suprapubic area.     Comments: Gravid  Skin:    General: Skin is warm.     Capillary Refill: Capillary refill takes less than 2 seconds.  Neurological:     Mental Status: She is alert.  Psychiatric:        Mood and Affect: Mood normal.        Behavior: Behavior is cooperative.    MAU Course/MDM  Procedures (including critical care time)  Patient Vitals for the past 24 hrs:  BP Temp Temp src Pulse Resp SpO2  05/12/19 0523 123/70 -- -- -- -- --  05/12/19 0505 -- -- -- -- -- 97 %  05/12/19  0502 132/63 -- -- (!) 114 -- --  05/12/19 0501 132/63 -- -- -- -- --  05/12/19 0441 127/69 -- -- (!) 101 -- --  05/12/19 0430 -- -- -- -- -- 98 %  05/12/19 0422 126/71 -- -- 80 -- --  05/12/19 0421 126/71 -- -- -- -- --  05/12/19 0350 123/71 (!) 97.1 F (36.2 C) Oral 98 (!) 22 98 %   Orders Placed This Encounter  Procedures  . Wet prep, genital  . Urinalysis, Routine w reflex microscopic  . Fetal fibronectin  . Insert peripheral IV   Meds ordered this encounter  Medications  . lactated ringers bolus 1,000 mL  . NIFEdipine (PROCARDIA) capsule 10 mg  . cyclobenzaprine (FLEXERIL) tablet 10 mg  . NIFEdipine (PROCARDIA) capsule 10 mg    4th dose, preterm contractions without cervical change  . NIFEdipine (PROCARDIA-XL/NIFEDICAL-XL) 24 hr tablet 30 mg  . oxyCODONE (Oxy IR/ROXICODONE) immediate release tablet 5 mg    0515: Returned to bedside at 0515. Pain score reduced from 8/10 to 7/10. Patient states sharp and severe contraction pain has resolved. Continues to feel left lower abdominal "soreness" which she states has been present each time something goes wrong".  0541: S/p Procardia x 4. 1L fluid bolus more than half finished. Reviewed with Dr. Macon Large, who advises treatment with Procardia XL and Roxicodone x 1  0551: Returned to bedside due to recurrence of strong abdominal pain and patient verbalizing that her "insides are being torn apart". Cervix remains closed.   1660: At bedside to evaluate patient's pain score given improved contraction pattern on Toco. Patient in fetal position, reporting pain score 8-9/10 which becomes 10/10 with fetal movement.  6301: Dr. Normand Sloop notified of chief complaint, evaluation and interventions in MAU. Confirmed NICU is full. Per Dr. Normand Sloop, Prothero, CNM will present to MAU to evaluate patient.  Results for orders placed or performed during the hospital encounter of 05/12/19 (from the past 24 hour(s))  Fetal fibronectin     Status: None   Collection Time: 05/12/19  4:15 AM  Result Value Ref Range   Fetal Fibronectin NEGATIVE NEGATIVE  Wet prep, genital     Status: Abnormal   Collection Time: 05/12/19  4:15 AM   Specimen: Cervix  Result Value Ref Range   Yeast Wet Prep HPF POC NONE SEEN NONE SEEN   Trich, Wet Prep NONE SEEN NONE SEEN   Clue Cells Wet Prep HPF POC NONE SEEN NONE SEEN   WBC, Wet Prep HPF POC MANY (A) NONE SEEN   Sperm NONE SEEN   Urinalysis, Routine w reflex microscopic     Status: None   Collection Time: 05/12/19  4:35 AM  Result Value Ref Range   Color, Urine YELLOW YELLOW   APPearance CLEAR CLEAR   Specific Gravity, Urine 1.013 1.005 - 1.030   pH 6.0 5.0 - 8.0   Glucose, UA NEGATIVE NEGATIVE mg/dL   Hgb urine dipstick NEGATIVE NEGATIVE   Bilirubin Urine NEGATIVE NEGATIVE   Ketones, ur NEGATIVE NEGATIVE mg/dL   Protein, ur  NEGATIVE NEGATIVE mg/dL   Nitrite NEGATIVE NEGATIVE   Leukocytes,Ua NEGATIVE NEGATIVE    A/P  --39 y.o. G2P0010 at [redacted]w[redacted]d  --Cat I tracing, contractions resolving with interventions --Pain score unchanged with interventions --S/p BMZ 01/18 and 01/19 --FFN negative --Per Dr. Normand Sloop at 6010, [facility] CNM inbound to unit for assessment and remaining management  Clayton Bibles, MSN, CNM Certified Nurse Midwife, Faculty [facility] 05/12/19 8:03 AM

## 2019-05-12 NOTE — Discharge Instructions (Signed)
Abdominal Pain During Pregnancy  Belly (abdominal) pain is common during pregnancy. There are many possible causes. Most of the time, it is not a serious problem. Other times, it can be a sign that something is wrong with the pregnancy. Always tell your doctor if you have belly pain. Follow these instructions at home:  Do not have sex or put anything in your vagina until your pain goes away completely.  Get plenty of rest until your pain gets better.  Drink enough fluid to keep your pee (urine) pale yellow.  Take over-the-counter and prescription medicines only as told by your doctor.  Keep all follow-up visits as told by your doctor. This is important. Contact a doctor if:  Your pain continues or gets worse after resting.  You have lower belly pain that: ? Comes and goes at regular times. ? Spreads to your back. ? Feels like menstrual cramps.  You have pain or burning when you pee (urinate). Get help right away if:  You have a fever or chills.  You have vaginal bleeding.  You are leaking fluid from your vagina.  You are passing tissue from your vagina.  You throw up (vomit) for more than 24 hours.  You have watery poop (diarrhea) for more than 24 hours.  Your baby is moving less than usual.  You feel very weak or faint.  You have shortness of breath.  You have very bad pain in your upper belly. Summary  Belly (abdominal) pain is common during pregnancy. There are many possible causes.  If you have belly pain during pregnancy, tell your doctor right away.  Keep all follow-up visits as told by your doctor. This is important. This information is not intended to replace advice given to you by your health care provider. Make sure you discuss any questions you have with your health care provider. Document Revised: 07/01/2018 Document Reviewed: 06/15/2016 Elsevier Patient Education  2020 Else[employer] Inc[employer]  

## 2019-05-12 NOTE — Discharge Summary (Signed)
  Patient Name: Tammie Frederick DOB: Jul 06, 1980 MRN: [id number]  Date of admission: 05/12/2019 Intrauterine pregnancy: [redacted]w[redacted]d  Admitting diagnosis: 32 wks severe abd pain Secondary diagnosis: None  Date of discharge: 05/12/2019    Discharge diagnosis: Round ligament pain, preterm pregnancy, undelivered     Prenatal history: G2P0010   EDC : 07/06/2019, by Other Basis  Prenatal care at [facility] Primary provider : N/A Prenatal course complicated by threatened preterm labor @ 28 weeks, betamethasone complete  Prenatal Labs: ABO, Rh: --/--/O POS, O POS [facility] [id number], [place] [address] ([phone])  Antibody: NEG (01/18 1337) Rubella:  immune  / MMR booster  RPR:   NR HBsAg:   negative HIV:   negative  GBS: --/NEGATIVE (01/18 2014)                                     Labs: Lab Results  Component Value Date   WBC 13.2 (H) 05/12/2019   HGB 11.7 (L) 05/12/2019   HCT 34.8 (L) 05/12/2019   MCV 88.1 05/12/2019   PLT 333 05/12/2019   No flowsheet data found.  Physical Exam @ time of discharge:  Vitals:   05/12/19 0505 05/12/19 0523 05/12/19 1200 05/12/19 1319  BP:  123/70 124/76 118/69  Pulse:   87 88  Resp:   18 18  Temp:   98.5 F (36.9 C)   TempSrc:   Oral   SpO2: 97%  98%    general: alert, cooperative and no distress Abdomen: soft, gravid, non-tender, palpable fetal movement  DVT Evaluation: No evidence of DVT seen on physical exam. No cords or calf tenderness, trace edema to BLE  Discharge Medications:  Allergies as of 05/12/2019      Reactions   Sulfa Antibiotics Other (See Comments)   Stomach pain   Cauliflower [brassica Oleracea] Itching   Itching of the mouth and throat   Latex Swelling   Zithromax [azithromycin] Other (See Comments)   Stomach pains   Zyrtec [cetirizine] Rash      Medication List    TAKE these medications   acetaminophen 500 MG tablet Commonly known as: TYLENOL Take 500-1,000 mg by mouth every  6 (six) hours as needed for mild pain, moderate pain or headache.   albuterol 108 (90 Base) MCG/ACT inhaler Commonly known as: VENTOLIN HFA Inhale 2 puffs into the lungs every 4 (four) hours as needed for wheezing or shortness of breath.   cyclobenzaprine 5 MG tablet Commonly known as: FLEXERIL Take 1 tablet (5 mg total) by mouth 3 (three) times daily as needed for muscle spasms.   famotidine 20 MG tablet Commonly known as: PEPCID Take 20 mg by mouth daily as needed for heartburn or indigestion.   loratadine 10 MG tablet Commonly known as: CLARITIN Take 10 mg by mouth daily as needed for allergies.   NIFEdipine 10 MG capsule Commonly known as: PROCARDIA Take 1 capsule (10 mg total) by mouth every 6 (six) hours.   prenatal multivitamin Tabs tablet Take 1 tablet by mouth daily at 12 noon.      Diet: routine diet Follow up:2-3 days  Signed: VArrie EasternCNM, MSN 05/12/2019, 1:39 PM

## 2019-05-12 NOTE — Progress Notes (Signed)
S: Treated in MAU for c/o severe abd pain. Pt recievd IV fluids, Flexeril, Procardia, and Roxicodone. Pain was not relived. Ruled out bladder infection, constipation, no S/S of renal stone. LLQ pain has improved from 8/10 to 5/10 when stationary and 9/10 to 6/10 when mobile since meal, PO and IV hydration. Active fetal movement palpable and perceived by pt. Pt does not perceive ctx.   O:  Today's Vitals   05/12/19 0812 05/12/19 1100 05/12/19 1200 05/12/19 1319  BP:   124/76 118/69  Pulse:   87 88  Resp:   18 18  Temp:   98.5 F (36.9 C)   TempSrc:   Oral   SpO2:   98%   PainSc: 7  7  7  7     There is no height or weight on file to calculate BMI.; U/S w/ normal findings, vertex, anterior placenta, AFI 16.89  A/P: 39 y.o. G2P0010 [redacted]w[redacted]d Round ligament pain    -comfort measures Hx of threatened preterm labor @ 28 wks, BMZ complete     -continue Procardia PRN ctx Follow-up in office Thur or Fri Plan discussed w/ Dr. Sun DC home  Estanislado Pandy, MSN, CNM 05/12/2019 2:05 PM

## 2019-06-24 ENCOUNTER — Telehealth (HOSPITAL_COMMUNITY): Payer: Self-pay | Admitting: *Deleted

## 2019-06-24 ENCOUNTER — Encounter (HOSPITAL_COMMUNITY): Payer: Self-pay | Admitting: *Deleted

## 2019-06-24 NOTE — Telephone Encounter (Signed)
Preadmission screen  

## 2019-06-25 ENCOUNTER — Other Ambulatory Visit: Payer: Self-pay | Admitting: Obstetrics & Gynecology

## 2019-06-28 ENCOUNTER — Other Ambulatory Visit (HOSPITAL_COMMUNITY)
Admission: RE | Admit: 2019-06-28 | Discharge: 2019-06-28 | Disposition: A | Discharge status: Home / Self Care | Payer: 59 | Source: Ambulatory Visit | Attending: Obstetrics & Gynecology | Admitting: Obstetrics & Gynecology

## 2019-06-28 LAB — SARS CORONAVIRUS 2 (TAT 6-24 HRS): SARS Coronavirus 2: NEGATIVE

## 2019-06-30 ENCOUNTER — Encounter (HOSPITAL_COMMUNITY): Payer: Self-pay | Admitting: Obstetrics and Gynecology

## 2019-06-30 ENCOUNTER — Inpatient Hospital Stay (HOSPITAL_COMMUNITY): Payer: 59

## 2019-06-30 ENCOUNTER — Inpatient Hospital Stay (HOSPITAL_COMMUNITY): Payer: 59 | Admitting: Anesthesiology

## 2019-06-30 ENCOUNTER — Inpatient Hospital Stay (HOSPITAL_COMMUNITY)
Admission: AD | Admit: 2019-06-30 | Discharge: 2019-07-02 | DRG: 807 | Disposition: A | Discharge status: Home / Self Care | Payer: 59 | Attending: Obstetrics & Gynecology | Admitting: Obstetrics & Gynecology

## 2019-06-30 ENCOUNTER — Other Ambulatory Visit: Payer: Self-pay

## 2019-06-30 DIAGNOSIS — O09529 Supervision of elderly multigravida, unspecified trimester: Secondary | ICD-10-CM

## 2019-06-30 DIAGNOSIS — O365930 Maternal care for other known or suspected poor fetal growth, third trimester, not applicable or unspecified: Secondary | ICD-10-CM | POA: Diagnosis present

## 2019-06-30 DIAGNOSIS — O26893 Other specified pregnancy related conditions, third trimester: Secondary | ICD-10-CM | POA: Diagnosis present

## 2019-06-30 DIAGNOSIS — Z20822 Contact with and (suspected) exposure to covid-19: Secondary | ICD-10-CM | POA: Diagnosis present

## 2019-06-30 DIAGNOSIS — M62838 Other muscle spasm: Secondary | ICD-10-CM

## 2019-06-30 DIAGNOSIS — O4703 False labor before 37 completed weeks of gestation, third trimester: Secondary | ICD-10-CM

## 2019-06-30 DIAGNOSIS — Z3A39 39 weeks gestation of pregnancy: Secondary | ICD-10-CM

## 2019-06-30 DIAGNOSIS — O365920 Maternal care for other known or suspected poor fetal growth, second trimester, not applicable or unspecified: Secondary | ICD-10-CM

## 2019-06-30 HISTORY — DX: Supervision of elderly multigravida, unspecified trimester: O09.529

## 2019-06-30 HISTORY — DX: Anxiety disorder, unspecified: F41.9

## 2019-06-30 HISTORY — DX: Unspecified asthma, uncomplicated: J45.909

## 2019-06-30 LAB — CBC
HCT: 38.3 % (ref 36.0–46.0)
Hemoglobin: 12.2 g/dL (ref 12.0–15.0)
MCH: 28.3 pg (ref 26.0–34.0)
MCHC: 31.9 g/dL (ref 30.0–36.0)
MCV: 88.9 fL (ref 80.0–100.0)
Platelets: 366 10*3/uL (ref 150–400)
RBC: 4.31 MIL/uL (ref 3.87–5.11)
RDW: 14.3 % (ref 11.5–15.5)
WBC: 11.8 10*3/uL — ABNORMAL HIGH (ref 4.0–10.5)
nRBC: 0 % (ref 0.0–0.2)

## 2019-06-30 LAB — TYPE AND SCREEN
ABO/RH(D): O POS
Antibody Screen: NEGATIVE

## 2019-06-30 LAB — RPR: RPR Ser Ql: NONREACTIVE

## 2019-06-30 MED ORDER — FENTANYL-BUPIVACAINE-NACL 0.5-0.125-0.9 MG/250ML-% EP SOLN
12.0000 mL/h | EPIDURAL | Status: DC | PRN
  Filled 2019-06-30: qty 250

## 2019-06-30 MED ORDER — EPHEDRINE 5 MG/ML INJ: 10.0000 mg | INTRAVENOUS | Status: DC | PRN

## 2019-06-30 MED ORDER — ONDANSETRON HCL 4 MG/2ML IJ SOLN: 4.0000 mg | Freq: Four times a day (QID) | INTRAMUSCULAR | Status: DC | PRN

## 2019-06-30 MED ORDER — OXYTOCIN BOLUS FROM INFUSION
500.0000 mL | Freq: Once | INTRAVENOUS | Status: AC
  Administered 2019-07-01: 06:00:00 500 mL via INTRAVENOUS

## 2019-06-30 MED ORDER — LACTATED RINGERS IV SOLN: INTRAVENOUS | Status: DC

## 2019-06-30 MED ORDER — LACTATED RINGERS IV SOLN: 500.0000 mL | INTRAVENOUS | Status: DC | PRN

## 2019-06-30 MED ORDER — OXYTOCIN 40 UNITS IN NORMAL SALINE INFUSION - SIMPLE MED
2.5000 [IU]/h | INTRAVENOUS | Status: DC
  Administered 2019-07-01: 07:00:00 2.5 [IU]/h via INTRAVENOUS

## 2019-06-30 MED ORDER — LIDOCAINE HCL (PF) 1 % IJ SOLN
INTRAMUSCULAR | Status: DC | PRN
  Administered 2019-06-30 (×2): 4 mL via EPIDURAL

## 2019-06-30 MED ORDER — SOD CITRATE-CITRIC ACID 500-334 MG/5ML PO SOLN: 30.0000 mL | ORAL | Status: DC | PRN

## 2019-06-30 MED ORDER — TERBUTALINE SULFATE 1 MG/ML IJ SOLN: 0.2500 mg | Freq: Once | INTRAMUSCULAR | Status: DC | PRN

## 2019-06-30 MED ORDER — SODIUM CHLORIDE (PF) 0.9 % IJ SOLN
INTRAMUSCULAR | Status: DC | PRN
  Administered 2019-06-30: 12 mL/h via EPIDURAL

## 2019-06-30 MED ORDER — FLEET ENEMA 7-19 GM/118ML RE ENEM: 1.0000 | ENEMA | RECTAL | Status: DC | PRN

## 2019-06-30 MED ORDER — PHENYLEPHRINE 40 MCG/ML (10ML) SYRINGE FOR IV PUSH (FOR BLOOD PRESSURE SUPPORT): 80.0000 ug | PREFILLED_SYRINGE | INTRAVENOUS | Status: DC | PRN

## 2019-06-30 MED ORDER — OXYTOCIN 40 UNITS IN NORMAL SALINE INFUSION - SIMPLE MED
1.0000 m[IU]/min | INTRAVENOUS | Status: DC
  Administered 2019-06-30: 10:00:00 2 m[IU]/min via INTRAVENOUS
  Filled 2019-06-30: qty 1000

## 2019-06-30 MED ORDER — ACETAMINOPHEN 325 MG PO TABS: 650.0000 mg | ORAL_TABLET | ORAL | Status: DC | PRN

## 2019-06-30 MED ORDER — MISOPROSTOL 25 MCG QUARTER TABLET: 25.0000 ug | ORAL_TABLET | ORAL | Status: DC | PRN

## 2019-06-30 MED ORDER — LACTATED RINGERS IV SOLN
500.0000 mL | Freq: Once | INTRAVENOUS | Status: AC
  Administered 2019-06-30: 500 mL via INTRAVENOUS

## 2019-06-30 MED ORDER — LIDOCAINE HCL (PF) 1 % IJ SOLN
30.0000 mL | INTRAMUSCULAR | Status: AC | PRN
  Administered 2019-07-01: 06:00:00 30 mL via SUBCUTANEOUS
  Filled 2019-06-30: qty 30

## 2019-06-30 MED ORDER — LACTATED RINGERS IV SOLN: 500.0000 mL | Freq: Once | INTRAVENOUS | Status: DC

## 2019-06-30 MED ORDER — BUTALBITAL-APAP-CAFFEINE 50-325-40 MG PO TABS: 2.0000 | ORAL_TABLET | Freq: Four times a day (QID) | ORAL | Status: DC | PRN

## 2019-06-30 MED ORDER — DIPHENHYDRAMINE HCL 50 MG/ML IJ SOLN: 12.5000 mg | INTRAMUSCULAR | Status: DC | PRN

## 2019-06-30 NOTE — Progress Notes (Signed)
Tammie Frederick is a 39 y.o. G2P0010 at [redacted]w[redacted]d IOL AMA Pitocin infusing 16 mU  Subjective: Comfortable with Epidural  Objective: BP 128/74   Pulse 78   Temp 98.4 F (36.9 C) (Oral)   Resp 18   Ht 5\' 6"  (1.676 m)   Wt 91.9 kg   LMP 07/16/2018   SpO2 97%   BMI 32.70 kg/m   FHT:  FHR: 130s bpm, variability: moderate,  accelerations:  Present,  decelerations:  Absent UC:   irregular, every 2-5 minutes SVE:  4/80/-1 AROM 1610, clear fluid, IUPC placed Cat I   Labs: Lab Results  Component Value Date   WBC 11.8 (H) 06/30/2019   HGB 12.2 06/30/2019   HCT 38.3 06/30/2019   MCV 88.9 06/30/2019   PLT 366 06/30/2019    Assessment / Plan: Induction of labor due to AMA,  progressing well on pitocin  Labor: AROM complete. Continue pitocin per protocol. IUPC placed Preeclampsia:  no signs or symptoms of toxicity Fetal Wellbeing:  Category I Pain Control:  Epidural I/D:  n/a Anticipated MOD:  NSVD  Tammie Frederick 06/30/2019, 6:49 PM

## 2019-06-30 NOTE — Anesthesia Procedure Notes (Signed)
Epidural Patient location during procedure: OB Start time: 06/30/2019 3:25 PM End time: 06/30/2019 3:34 PM  Staffing Anesthesiologist: Mal Amabile, MD Performed: anesthesiologist   Preanesthetic Checklist Completed: patient identified, IV checked, site marked, risks and benefits discussed, surgical consent, monitors and equipment checked, pre-op evaluation and timeout performed  Epidural Patient position: sitting Prep: DuraPrep and site prepped and draped Patient monitoring: continuous pulse ox and blood pressure Approach: midline Location: L4-L5 Injection technique: LOR air  Needle:  Needle type: Tuohy  Needle gauge: 17 G Needle length: 9 cm and 9 Needle insertion depth: 7 cm Catheter type: closed end flexible Catheter size: 19 Gauge Catheter at skin depth: 12 cm Test dose: negative and Other  Assessment Events: blood not aspirated, injection not painful, no injection resistance, no paresthesia and negative IV test  Additional Notes Patient identified. Risks and benefits discussed including failed block, incomplete  Pain control, post dural puncture headache, nerve damage, paralysis, blood pressure Changes, nausea, vomiting, reactions to medications-both toxic and allergic and post Partum back pain. All questions were answered. Patient expressed understanding and wished to proceed. Sterile technique was used throughout procedure. Epidural site was Dressed with sterile barrier dressing. No paresthesias, signs of intravascular injection Or signs of intrathecal spread were encountered.  Patient was more comfortable after the epidural was dosed. Please see RN's note for documentation of vital signs and FHR which are stable. Reason for block:procedure for pain

## 2019-06-30 NOTE — Progress Notes (Signed)
Tammie Frederick is a 39 y.o. G2P0010 at [redacted]w[redacted]d IOL AMA Pitocin infusing 15 mU  Subjective: Comfortable with Epidural  Objective: BP 124/81   Pulse 86   Temp 98.2 F (36.8 C) (Oral)   Resp 18   Ht 5\' 6"  (1.676 m)   Wt 91.9 kg   LMP 07/16/2018   BMI 32.70 kg/m   FHT:  FHR: 130s bpm, variability: moderate,  accelerations:  Present,  decelerations:  Absent UC:   irregular, every 2-5 minutes SVE:  4/80/-1 AROM 1610, clear fluid Cat I   Labs: Lab Results  Component Value Date   WBC 11.8 (H) 06/30/2019   HGB 12.2 06/30/2019   HCT 38.3 06/30/2019   MCV 88.9 06/30/2019   PLT 366 06/30/2019    Assessment / Plan: Induction of labor due to AMA,  progressing well on pitocin  Labor: AROM complete. Continue pitocin per protocol Preeclampsia:  no signs or symptoms of toxicity Fetal Wellbeing:  Category I Pain Control:  Epidural I/D:  n/a Anticipated MOD:  NSVD  Janeth Terry B Ruxin Ransome 06/30/2019, 4:21 PM

## 2019-06-30 NOTE — Anesthesia Preprocedure Evaluation (Signed)
Anesthesia Evaluation  Patient identified by MRN, date of birth, ID band Patient awake    Reviewed: Allergy & Precautions, Patient's Chart, lab work & pertinent test results  Airway Mallampati: II  TM Distance: >3 FB Neck ROM: Full    Dental no notable dental hx. (+) Teeth Intact   Pulmonary asthma ,    Pulmonary exam normal breath sounds clear to auscultation       Cardiovascular negative cardio ROS Normal cardiovascular exam Rhythm:Regular Rate:Normal     Neuro/Psych Anxiety negative neurological ROS     GI/Hepatic Neg liver ROS, GERD  ,  Endo/Other  Obesity  Renal/GU negative Renal ROS  negative genitourinary   Musculoskeletal Back injury 2012 Compression Fx L1-L5 S/P Kyphoplasty Chronic lower back pain and bilateral hip pain   Abdominal (+) + obese,   Peds  Hematology   Anesthesia Other Findings   Reproductive/Obstetrics (+) Pregnancy                             Anesthesia Physical Anesthesia Plan  ASA: II  Anesthesia Plan: Epidural   Post-op Pain Management:    Induction:   PONV Risk Score and Plan:   Airway Management Planned: Natural Airway  Additional Equipment:   Intra-op Plan:   Post-operative Plan:   Informed Consent: I have reviewed the patients History and Physical, chart, labs and discussed the procedure including the risks, benefits and alternatives for the proposed anesthesia with the patient or authorized representative who has indicated his/her understanding and acceptance.       Plan Discussed with: Anesthesiologist  Anesthesia Plan Comments: (Discussed risks, benefits and alternatives of lumbar epidural for labor and delivery. I do not feel that her prior back injury is a contraindication for a labor epidural. Will proceed with labor epidural when she is ready.)        Anesthesia Quick Evaluation

## 2019-06-30 NOTE — Plan of Care (Signed)
  Problem: Education: Goal: Knowledge of General Education information will improve Description: Including pain rating scale, medication(s)/side effects and non-pharmacologic comfort measures Outcome: Progressing   Problem: Pain Management: Goal: Relief or control of pain from uterine contractions will improve Outcome: Completed/Met

## 2019-06-30 NOTE — H&P (Signed)
Tammie Frederick is a 39 y.o. female presenting for IOL for AMA. Denies contractions, vb, or lof. Reports adequate fetal movement  OB History    Gravida  2   Para  0   Term  0   Preterm  0   AB  1   Living  0     SAB  1   TAB  0   Ectopic  0   Multiple  0   Live Births  0          Past Medical History:  Diagnosis Date  . Anxiety   . Asthma    occasional inhaler use  . Back injury   . Broken finger   . H/O sinus surgery   . History of back surgery   . HPV (human papilloma virus) anogenital infection   . Preterm labor    PTL starting @ 28 wks  . Vaginal Pap smear, abnormal    Past Surgical History:  Procedure Laterality Date  . BACK SURGERY    . CERVICAL BIOPSY  W/ LOOP ELECTRODE EXCISION    . FRACTURE SURGERY     fixation of fracture using dynamic compression system  . NASAL SINUS SURGERY     multiple   Family History: family history includes Breast cancer in her maternal grandmother; Diabetes in her paternal grandfather; Lung cancer in her maternal grandmother. Social History:  reports that she has never smoked. She has never used smokeless tobacco. She reports previous alcohol use. She reports that she does not use drugs.     Maternal Diabetes: No Genetic Screening: Normal Maternal Ultrasounds/Referrals: Normal Fetal Ultrasounds or other Referrals:  None Maternal Substance Abuse:  No Significant Maternal Medications:  None Significant Maternal Lab Results:  Group B Strep negative Other Comments:  None  Review of Systems  Constitutional: Negative.   HENT: Negative.   Eyes: Negative.   Respiratory: Negative.   Cardiovascular: Negative.   Gastrointestinal: Negative.   Endocrine: Negative.   Genitourinary: Negative.   Musculoskeletal: Negative.   Skin: Negative.   Neurological: Negative.   Hematological: Negative.   Psychiatric/Behavioral: Negative.    History Dilation: 2.5 Effacement (%): 80 Station: -1 Exam by:: Tammie Frederick,  CNM Blood pressure 129/76, pulse 81, temperature 98 F (36.7 C), temperature source Oral, height 5\' 6"  (1.676 m), weight 91.9 kg, last menstrual period 07/16/2018, unknown if currently breastfeeding. Exam Physical Exam  Constitutional: She is oriented to person, place, and time. She appears well-developed and well-nourished.  HENT:  Head: Normocephalic.  Eyes: Pupils are equal, round, and reactive to light. Conjunctivae and EOM are normal.  Cardiovascular: Normal rate.  Respiratory: Effort normal and breath sounds normal.  GI: Soft.  Genitourinary:    Vagina normal.   Musculoskeletal:        General: Normal range of motion.     Cervical back: Normal range of motion.  Neurological: She is alert and oriented to person, place, and time.  Skin: Skin is warm and dry.  Psychiatric: She has a normal mood and affect. Her behavior is normal. Judgment and thought content normal.   Cat I FHR tracing Bedside 07/18/2018 confirms vertex Prenatal labs: ABO, Rh: --/--/O POS, O POS Performed at [facility], [address], [place], [place]  [phone]) Antibody: NEG (01/18 1337) Rubella: Immune (09/22 0000) RPR: Nonreactive (09/22 0000)  HBsAg: Negative (09/22 0000)  HIV: Non-reactive (09/22 0000)  GBS: NEGATIVE/-- (01/18 2014)   Assessment/Plan: Admit for IOL Begin Pitocin  per protocol Anesthesia consult for pts history of surgery Epidural when desires Dr. Alwyn Frederick desires to special and aware of her status and plan of care  Tammie Frederick 06/30/2019, 10:13 AM

## 2019-07-01 ENCOUNTER — Encounter (HOSPITAL_COMMUNITY): Payer: Self-pay | Admitting: Obstetrics and Gynecology

## 2019-07-01 MED ORDER — COCONUT OIL OIL: 1.0000 "application " | TOPICAL_OIL | Status: DC | PRN

## 2019-07-01 MED ORDER — SENNOSIDES-DOCUSATE SODIUM 8.6-50 MG PO TABS
2.0000 | ORAL_TABLET | ORAL | Status: DC
  Administered 2019-07-01: 2 via ORAL
  Filled 2019-07-01: qty 2

## 2019-07-01 MED ORDER — IBUPROFEN 600 MG PO TABS
600.0000 mg | ORAL_TABLET | Freq: Four times a day (QID) | ORAL | Status: DC
  Administered 2019-07-01 – 2019-07-02 (×4): 600 mg via ORAL
  Filled 2019-07-01 (×4): qty 1

## 2019-07-01 MED ORDER — LACTATED RINGERS IV SOLN: INTRAVENOUS | Status: DC

## 2019-07-01 MED ORDER — PRENATAL MULTIVITAMIN CH
1.0000 | ORAL_TABLET | Freq: Every day | ORAL | Status: DC
  Administered 2019-07-01: 12:00:00 1 via ORAL
  Filled 2019-07-01: qty 1

## 2019-07-01 MED ORDER — ONDANSETRON HCL 4 MG/2ML IJ SOLN
4.0000 mg | INTRAMUSCULAR | Status: DC | PRN
  Administered 2019-07-01: 10:00:00 4 mg via INTRAVENOUS
  Filled 2019-07-01: qty 2

## 2019-07-01 MED ORDER — FAMOTIDINE 20 MG PO TABS: 20.0000 mg | ORAL_TABLET | Freq: Every day | ORAL | Status: DC | PRN

## 2019-07-01 MED ORDER — ALBUTEROL SULFATE (2.5 MG/3ML) 0.083% IN NEBU: 3.0000 mL | INHALATION_SOLUTION | RESPIRATORY_TRACT | Status: DC | PRN

## 2019-07-01 MED ORDER — TETANUS-DIPHTH-ACELL PERTUSSIS 5-2.5-18.5 LF-MCG/0.5 IM SUSP: 0.5000 mL | Freq: Once | INTRAMUSCULAR | Status: DC

## 2019-07-01 MED ORDER — METHYLERGONOVINE MALEATE 0.2 MG PO TABS: 0.2000 mg | ORAL_TABLET | ORAL | Status: DC | PRN

## 2019-07-01 MED ORDER — DIPHENHYDRAMINE HCL 25 MG PO CAPS: 25.0000 mg | ORAL_CAPSULE | Freq: Four times a day (QID) | ORAL | Status: DC | PRN

## 2019-07-01 MED ORDER — SIMETHICONE 80 MG PO CHEW: 80.0000 mg | CHEWABLE_TABLET | ORAL | Status: DC | PRN

## 2019-07-01 MED ORDER — BENZOCAINE-MENTHOL 20-0.5 % EX AERO
1.0000 "application " | INHALATION_SPRAY | CUTANEOUS | Status: DC | PRN
  Administered 2019-07-01: 1 via TOPICAL
  Filled 2019-07-01: qty 56

## 2019-07-01 MED ORDER — OXYCODONE-ACETAMINOPHEN 5-325 MG PO TABS
1.0000 | ORAL_TABLET | ORAL | Status: DC | PRN
  Administered 2019-07-01: 23:00:00 1 via ORAL
  Filled 2019-07-01 (×2): qty 1

## 2019-07-01 MED ORDER — ZOLPIDEM TARTRATE 5 MG PO TABS: 5.0000 mg | ORAL_TABLET | Freq: Every evening | ORAL | Status: DC | PRN

## 2019-07-01 MED ORDER — ACETAMINOPHEN 325 MG PO TABS: 650.0000 mg | ORAL_TABLET | ORAL | Status: DC | PRN

## 2019-07-01 MED ORDER — METHYLERGONOVINE MALEATE 0.2 MG/ML IJ SOLN: 0.2000 mg | INTRAMUSCULAR | Status: DC | PRN

## 2019-07-01 MED ORDER — WITCH HAZEL-GLYCERIN EX PADS: 1.0000 "application " | MEDICATED_PAD | CUTANEOUS | Status: DC | PRN

## 2019-07-01 MED ORDER — LORATADINE 10 MG PO TABS: 10.0000 mg | ORAL_TABLET | Freq: Every day | ORAL | Status: DC | PRN

## 2019-07-01 MED ORDER — ONDANSETRON HCL 4 MG PO TABS: 4.0000 mg | ORAL_TABLET | ORAL | Status: DC | PRN

## 2019-07-01 MED ORDER — DIBUCAINE (PERIANAL) 1 % EX OINT: 1.0000 "application " | TOPICAL_OINTMENT | CUTANEOUS | Status: DC | PRN

## 2019-07-01 MED ORDER — OXYCODONE-ACETAMINOPHEN 5-325 MG PO TABS: 2.0000 | ORAL_TABLET | ORAL | Status: DC | PRN

## 2019-07-01 NOTE — Anesthesia Postprocedure Evaluation (Signed)
Anesthesia Post Note  Patient: Tammie Frederick  Procedure(s) Performed: AN AD HOC LABOR EPIDURAL     Patient location during evaluation: Mother Baby Anesthesia Type: Epidural Level of consciousness: awake Pain management: satisfactory to patient Vital Signs Assessment: post-procedure vital signs reviewed and stable Respiratory status: spontaneous breathing Cardiovascular status: stable Anesthetic complications: no    Last Vitals:  Vitals:   07/01/19 0900 07/01/19 1300  BP: 129/68 127/70  Pulse: 86 84  Resp: 17 16  Temp: 36.9 C 36.8 C  SpO2: 100% 99%    Last Pain:  Vitals:   07/01/19 1300  TempSrc: Oral  PainSc: 0-No pain   Pain Goal:                   [employer]

## 2019-07-01 NOTE — Progress Notes (Signed)
Tammie Frederick is a 39 y.o. G2P0010 at [redacted]w[redacted]d IOL AMA Pitocin infusing 22 mU AROM 1610 IUPC in place, MVUs adequate  Subjective: Comfortable with Epidural  Objective: BP 119/69   Pulse 75   Temp 97.9 F (36.6 C) (Oral)   Resp 18   Ht 5\' 6"  (1.676 m)   Wt 91.9 kg   LMP 07/16/2018   SpO2 97%   BMI 32.70 kg/m   FHT:  FHR: 130s bpm, variability: moderate,  accelerations:  Present,  decelerations:  Absent UC:   irregular, every 2-5 minutes SVE:  10/100/+3 AROM 1610, clear fluid, IUPC placed Cat I   Labs: Lab Results  Component Value Date   WBC 11.8 (H) 06/30/2019   HGB 12.2 06/30/2019   HCT 38.3 06/30/2019   MCV 88.9 06/30/2019   PLT 366 06/30/2019    Assessment / Plan: Induction of labor due to AMA,  progressing well on pitocin  Labor: AROM complete. Continue pitocin per protocol. IUPC placed Preeclampsia:  no signs or symptoms of toxicity Fetal Wellbeing:  Category I Pain Control:  Epidural I/D:  n/a Anticipated MOD:  NSVD. Dr 08/30/2019 notified for special delivery, en route  Tammie Frederick 07/01/2019, 5:46 AM

## 2019-07-01 NOTE — Progress Notes (Signed)
Tammie Frederick is a 39 y.o. G2P0010 at [redacted]w[redacted]d IOL AMA Pitocin infusing 20 mU AROM 1610 IUPC in place, MVUs adequate  Subjective: Comfortable with Epidural  Objective: BP 119/69   Pulse 75   Temp 97.9 F (36.6 C) (Oral)   Resp 18   Ht 5\' 6"  (1.676 m)   Wt 91.9 kg   LMP 07/16/2018   SpO2 97%   BMI 32.70 kg/m   FHT:  FHR: 130s bpm, variability: moderate,  accelerations:  Present,  decelerations:  Absent UC:   irregular, every 2-5 minutes SVE:  5-6/80/-1 AROM 1610, clear fluid, IUPC placed Cat I   Labs: Lab Results  Component Value Date   WBC 11.8 (H) 06/30/2019   HGB 12.2 06/30/2019   HCT 38.3 06/30/2019   MCV 88.9 06/30/2019   PLT 366 06/30/2019    Assessment / Plan: Induction of labor due to AMA,  progressing well on pitocin  Labor: AROM complete. Continue pitocin per protocol. IUPC placed Preeclampsia:  no signs or symptoms of toxicity Fetal Wellbeing:  Category I Pain Control:  Epidural I/D:  n/a Anticipated MOD:  NSVD  Aedan Geimer B Mase Dhondt 07/01/2019, 5:45 AM

## 2019-07-02 LAB — CBC
HCT: 39 % (ref 36.0–46.0)
Hemoglobin: 12.3 g/dL (ref 12.0–15.0)
MCH: 28.5 pg (ref 26.0–34.0)
MCHC: 31.5 g/dL (ref 30.0–36.0)
MCV: 90.5 fL (ref 80.0–100.0)
Platelets: 337 10*3/uL (ref 150–400)
RBC: 4.31 MIL/uL (ref 3.87–5.11)
RDW: 14.6 % (ref 11.5–15.5)
WBC: 17 10*3/uL — ABNORMAL HIGH (ref 4.0–10.5)
nRBC: 0 % (ref 0.0–0.2)

## 2019-07-02 LAB — BIRTH TISSUE RECOVERY COLLECTION (PLACENTA DONATION)

## 2019-07-02 MED ORDER — IBUPROFEN 600 MG PO TABS: 600.0000 mg | ORAL_TABLET | Freq: Four times a day (QID) | ORAL | 0 refills | Status: DC

## 2019-07-02 MED ORDER — BENZOCAINE-MENTHOL 20-0.5 % EX AERO: 1.0000 "application " | INHALATION_SPRAY | CUTANEOUS | 2 refills | Status: DC | PRN

## 2019-07-02 NOTE — Progress Notes (Signed)
CSW received consult for hx of Anxiety.  CSW met with MOB to offer support and complete assessment.    CSW congratulated MOB and FOB on the birth of infant. CSW advised MOB of CSW's role and the reason for CSW coming to visit with her. MOB reports that she was diagnosed with anxiety in high school or college years. MOB reports that she has never been in therapy but would take Xanax intermittently. MOB reported that during her pregnancy her anxiety was "wonderful". MOB reported that she hasn't felt SI or HI and reported no desire for resources on therapy or medication management t at this time. CSW understanding of this.   CSW inquired from [facility] on any other mental health diagnosis in which MOB denied having. MOB reports that she ha support from her friends, significant other, and family that will be in town next week from [place]. MOB reported that she has all needed items to care for infant with not other needs at this time.   CSW provided education regarding the baby blues period vs. perinatal mood disorders, discussed treatment and gave resources for mental health follow up if concerns arise.  CSW recommends self-evaluation during the postpartum time period using the New Mom Checklist from Postpartum Progress and encouraged MOB to contact a medical professional if symptoms are noted at any time.   CSW provided review of Sudden Infant Death Syndrome (SIDS) precautions.   CSW identifies no further need for intervention and no barriers to discharge at this time.   Tammie Frederick, MSW, LCSW [facility] [place] [phone]

## 2019-07-02 NOTE — Lactation Note (Signed)
This note was copied from a baby's chart. Lactation Consultation Note  Patient Name: Tammie Frederick PZWCH'E Date: 07/02/2019 Reason for consult: Follow-up assessment;Other (Comment)(per C , [facility] ) mom changed to formula)   Maternal Data    Feeding Feeding Type: Bottle Fed - Formula Nipple Type: Slow - flow  LATCH Score                   Interventions    Lactation Tools Discussed/Used     Consult Status Consult Status: Complete Date: 07/02/19    [name] 07/02/2019, 11:40 AM

## 2019-07-02 NOTE — Discharge Summary (Signed)
NSVD OB Discharge Summary     Patient Name: Tammie Frederick DOB: 15-Apr-1980 MRN: [id number]  Date of admission: 06/30/2019 Delivering MD: Sanjuana Kava  Date of delivery: 07/01/2019 Type of delivery: NSVD  Newborn Data: Sex: Baby boy Name: Tammie Frederick Circumcision: No, needs peds evaluation Live born female  Birth Weight: 7 lb 2.5 oz (3246 g) APGAR: 55, 9  Newborn Delivery   Birth date/time: 07/01/2019 05:56:00 Delivery type: Vaginal, Spontaneous     Feeding: breast and bottle Infant being discharge to home with mother in stable condition.   Admitting diagnosis: AMA (advanced maternal age) multigravida 43+ [O51.529] Intrauterine pregnancy: [redacted]w[redacted]d     Secondary diagnosis:  Active Problems:   AMA (advanced maternal age) multigravida 35+                                Complications: None                                                              Intrapartum Procedures: spontaneous vaginal delivery Postpartum Procedures: none Complications-Operative and Postpartum: 2nd degree perineal laceration Augmentation: AROM and Pitocin   History of Present Illness: Ms. Tammie Frederick Weight is a 39 y.o. female, G2P1011, who presents at [redacted]w[redacted]d weeks gestation. The patient has been followed at  [facility]  Her pregnancy has been complicated by:  Patient Active Problem List   Diagnosis Date Noted  . AMA (advanced maternal age) multigravida 35+ 06/30/2019  . Muscle spasm 04/15/2019  . Threatened preterm labor 04/15/2019  . IUGR (intrauterine growth restriction) affecting care of mother 04/15/2019  . Short cervical length during pregnancy 04/14/2019  . LGSIL on Pap smear of cervix 04/14/2019  . H/O cone biopsy of cervix 03/27/2009    Hospital course:  Onset of Labor With Vaginal Delivery     39 y.o. yo G2P1011 at [redacted]w[redacted]d was admitted for induction of labor on 06/30/2019. Patient had an uncomplicated labor course as follows:  Membrane Rupture Time/Date: 4:10 PM ,06/30/2019    Intrapartum Procedures: Episiotomy: None [1]                                         Lacerations:  2nd degree [3];Perineal [11]  Patient had a delivery of a Viable infant. 07/01/2019  Information for the patient's newborn:  Tammie Frederick, Tammie Frederick [[id number]]  Delivery Method: Vaginal, Spontaneous(Filed from Delivery Summary)     Pateint had an uncomplicated postpartum course.  She is ambulating, tolerating a regular diet, passing flatus, and urinating well. Patient is discharged home in stable condition on 07/02/19.  Postpartum Day # 2 : S/P NSVD due to IOL. Patient up ad lib, denies syncope or dizziness. Reports consuming regular diet without issues and denies N/V. Patient reports no bowel movement but is passing flatus.  Denies issues with urination and reports bleeding is "fine."  Patient is breastfeeding and supplementing with formula and reports going well.  Desires IUD for postpartum contraception.  Pain is being appropriately managed with use of Motrin.   Physical exam  Vitals:   07/01/19 1300 07/01/19 1700 07/01/19 2100 07/02/19 0550  BP: 127/70 112/84 108/71 132/90  Pulse: 84 80 74 90  Resp: 16 17 18 18   Temp: 98.2 F (36.8 C) 98 F (36.7 C) 98.2 F (36.8 C) 97.9 F (36.6 C)  TempSrc: Oral Oral Oral Oral  SpO2: 99% 98%    Weight:      Height:       General: alert, cooperative and no distress Lochia: appropriate Uterine Fundus: firm Perineum: healing well DVT Evaluation: No evidence of DVT seen on physical exam.  Labs: Lab Results  Component Value Date   WBC 17.0 (H) 07/02/2019   HGB 12.3 07/02/2019   HCT 39.0 07/02/2019   MCV 90.5 07/02/2019   PLT 337 07/02/2019   No flowsheet data found.  Date of discharge: 07/02/2019 Discharge Diagnoses: Term Pregnancy-delivered Discharge instruction: per After Visit Summary and "Baby and Me Booklet".  After visit meds:   Activity:           pelvic rest Advance as tolerated. Pelvic rest for 6 weeks.  Diet:                 routine Medications: PNV and Ibuprofen Postpartum contraception: IUD Mirena in office at 6 weeks Condition:  Pt discharge to home with baby in stable condition  Meds: Allergies as of 07/02/2019      Reactions   Sulfa Antibiotics Other (See Comments)   Stomach pain   Cauliflower [brassica Oleracea] Itching   Itching of the mouth and throat   Dilaudid [hydromorphone Hcl] Itching   Face itching   Latex Swelling   Zithromax [azithromycin] Other (See Comments)   Stomach pains   Zyrtec [cetirizine] Rash      Medication List    STOP taking these medications   cyclobenzaprine 5 MG tablet Commonly known as: FLEXERIL   loratadine 10 MG tablet Commonly known as: CLARITIN   NIFEdipine 10 MG capsule Commonly known as: PROCARDIA     TAKE these medications   acetaminophen 500 MG tablet Commonly known as: TYLENOL Take 500-1,000 mg by mouth every 6 (six) hours as needed for mild pain, moderate pain or headache.   albuterol 108 (90 Base) MCG/ACT inhaler Commonly known as: VENTOLIN HFA Inhale 2 puffs into the lungs every 4 (four) hours as needed for wheezing or shortness of breath.   benzocaine-Menthol 20-0.5 % Aero Commonly known as: DERMOPLAST Apply 1 application topically as needed for irritation (perineal discomfort).   famotidine 20 MG tablet Commonly known as: PEPCID Take 20 mg by mouth daily as needed for heartburn or indigestion.   ibuprofen 600 MG tablet Commonly known as: ADVIL Take 1 tablet (600 mg total) by mouth every 6 (six) hours.   prenatal multivitamin Tabs tablet Take 1 tablet by mouth daily at 12 noon.       Discharge Follow Up:  Follow-up Information    [facility]. Schedule an appointment as soon as possible for a visit in 6 week(s).   Specialty: Obstetrics and Gynecology Contact information: [address] [place] [phone]         07/02/2019, 8:25 AM  09/01/2019,  CNM

## 2019-07-02 NOTE — Lactation Note (Signed)
This note was copied from a baby's chart. Lactation Consultation Note  Patient Name: Tammie Frederick GLOVF'I Date: 07/02/2019 Reason for consult: Initial assessment;1st time breastfeeding;Term P1,18 hour female infant. Mom has DEBP at home. Infant had 2 voids and 3 stools since birth. Mom's hx: IUGR, AMA and anxiety. Mom concern about Percocet which is combination drug of acetaminophen which is L1 and oxycodone which is probably compatible which is L3.  Per mom, infant has a bruised head. Infant had large emesis that was mucous and clear while LC was in the room.  Per mom, infant has not been latching well at breast, suckle few times then stop. Tools given: Mom was given hand pump and breast shells by RN due to having flat and inverted nipples, LC gave mom DEBP to help with breast stimulation, establish milk supply infant currently is not sustaining latch at breast. Mom made multiple attempts to latch infant, infant suckle few times at breast and then mom's nipples would flatten. LC fitted mom with 20 mm NS, LC asked mom pre-pump breast with hand pump then applied 20 mm NS, mom latched infant on her right  Breast using the football hold position. Infant would lick and taste, open mouth wide, suckle few times and would not sustain latch, was on and off breast, towards the end of feeding infant started sustaining latch but then had large emesis of clear mucous and fluid.  Infant breastfed for 7 minutes on and off breast. Mom hand expressed afterwards and infant took 5 mls of colostrum by spoon and seem content afterwards. Mom will continue to work on latching infant at breast.  Mom knows to ask RN or [place] for assistance with latching infant at breast if needed. Mom will use DEBP every 3 hours for 15 minutes on initial setting and mom will give infant back any breast milk that is expressed by spoon.  Mom shown how to use DEBP & how to disassemble, clean, & reassemble parts. LC assisted mom is using  DEBP as LC left room mom was expressing milk with DEBP.  LC discussed breastfeeding resources after discharge: [place] hotline, [facility] outpatient clinic and [place] online breastfeeding support group.   Mom's plan within first 24 hours: 1.  Mom will pump breast first with hand pump, then apply 20 mm NS before latching infant at breast. 2. Mom knows to breastfed infant according to hunger cues, 8 to 12 times within 24 hours and not exceed 3 hours without latching infant at breast. 3. Mom will use DEBP every 3 hours for 15 minutes on initial setting.  4. If infant is not latching mom will give any EBM that is pump and will hand express to give infant back volume. Maternal Data Formula Feeding for Exclusion: No Has patient been taught Hand Expression?: Yes Does the patient have breastfeeding experience prior to this delivery?: No  Feeding Feeding Type: Breast Fed  LATCH Score Latch: Repeated attempts needed to sustain latch, nipple held in mouth throughout feeding, stimulation needed to elicit sucking reflex.  Audible Swallowing: A few with stimulation  Type of Nipple: Inverted  Comfort (Breast/Nipple): Soft / non-tender  Hold (Positioning): Assistance needed to correctly position infant at breast and maintain latch.  LATCH Score: 5  Interventions Interventions: Breast feeding basics reviewed;Breast compression;Adjust position;Assisted with latch;Hand pump;DEBP;Support pillows;Skin to skin;Breast massage;Position options;Hand express;Expressed milk;Shells;Pre-pump if needed  Lactation Tools Discussed/Used Tools: Shells;Pump;Nipple Shields Nipple shield size: 20 Shell Type: Inverted(Mom has inverted and flat nipples.) Breast pump type: Double-Electric  Breast Pump WIC Program: No Pump Review: Setup, frequency, and cleaning;Milk Storage Initiated by:: Tammie Frederick, IBCLC Date initiated:: 07/02/19   Consult Status Consult Status: Follow-up Date: 07/02/19 Follow-up type:  In-patient    Tammie Frederick 07/02/2019, 12:30 AM

## 2020-11-08 ENCOUNTER — Encounter (HOSPITAL_BASED_OUTPATIENT_CLINIC_OR_DEPARTMENT_OTHER): Payer: Self-pay

## 2020-11-08 ENCOUNTER — Other Ambulatory Visit (HOSPITAL_BASED_OUTPATIENT_CLINIC_OR_DEPARTMENT_OTHER): Payer: Self-pay

## 2020-11-08 ENCOUNTER — Emergency Department (HOSPITAL_BASED_OUTPATIENT_CLINIC_OR_DEPARTMENT_OTHER): Payer: 59

## 2020-11-08 ENCOUNTER — Emergency Department (HOSPITAL_BASED_OUTPATIENT_CLINIC_OR_DEPARTMENT_OTHER)
Admission: EM | Admit: 2020-11-08 | Discharge: 2020-11-08 | Disposition: A | Discharge status: Home / Self Care | Payer: 59 | Attending: Emergency Medicine | Admitting: Emergency Medicine

## 2020-11-08 ENCOUNTER — Other Ambulatory Visit: Payer: Self-pay

## 2020-11-08 DIAGNOSIS — J069 Acute upper respiratory infection, unspecified: Secondary | ICD-10-CM

## 2020-11-08 DIAGNOSIS — Z20822 Contact with and (suspected) exposure to covid-19: Secondary | ICD-10-CM

## 2020-11-08 DIAGNOSIS — R059 Cough, unspecified: Secondary | ICD-10-CM | POA: Diagnosis present

## 2020-11-08 DIAGNOSIS — Z91040 Latex allergy status: Secondary | ICD-10-CM | POA: Insufficient documentation

## 2020-11-08 DIAGNOSIS — J45909 Unspecified asthma, uncomplicated: Secondary | ICD-10-CM | POA: Insufficient documentation

## 2020-11-08 DIAGNOSIS — U071 COVID-19: Secondary | ICD-10-CM | POA: Diagnosis not present

## 2020-11-08 LAB — RESP PANEL BY RT-PCR (FLU A&B, COVID) ARPGX2
Influenza A by PCR: NEGATIVE
Influenza B by PCR: NEGATIVE
SARS Coronavirus 2 by RT PCR: POSITIVE — AB

## 2020-11-08 IMAGING — DX DG CHEST 1V PORT
1 series · 1 of 1 positions shown · non-contrast
Comparison: Portable exam [TY] hours without priors for comparison

CLINICAL DATA: Cough for 2 days, became productive yesterday, thick
yellow sputum, some shortness of breath, dizziness, and body aches,
question pneumonia

EXAM:
PORTABLE CHEST 1 VIEW

[chest]
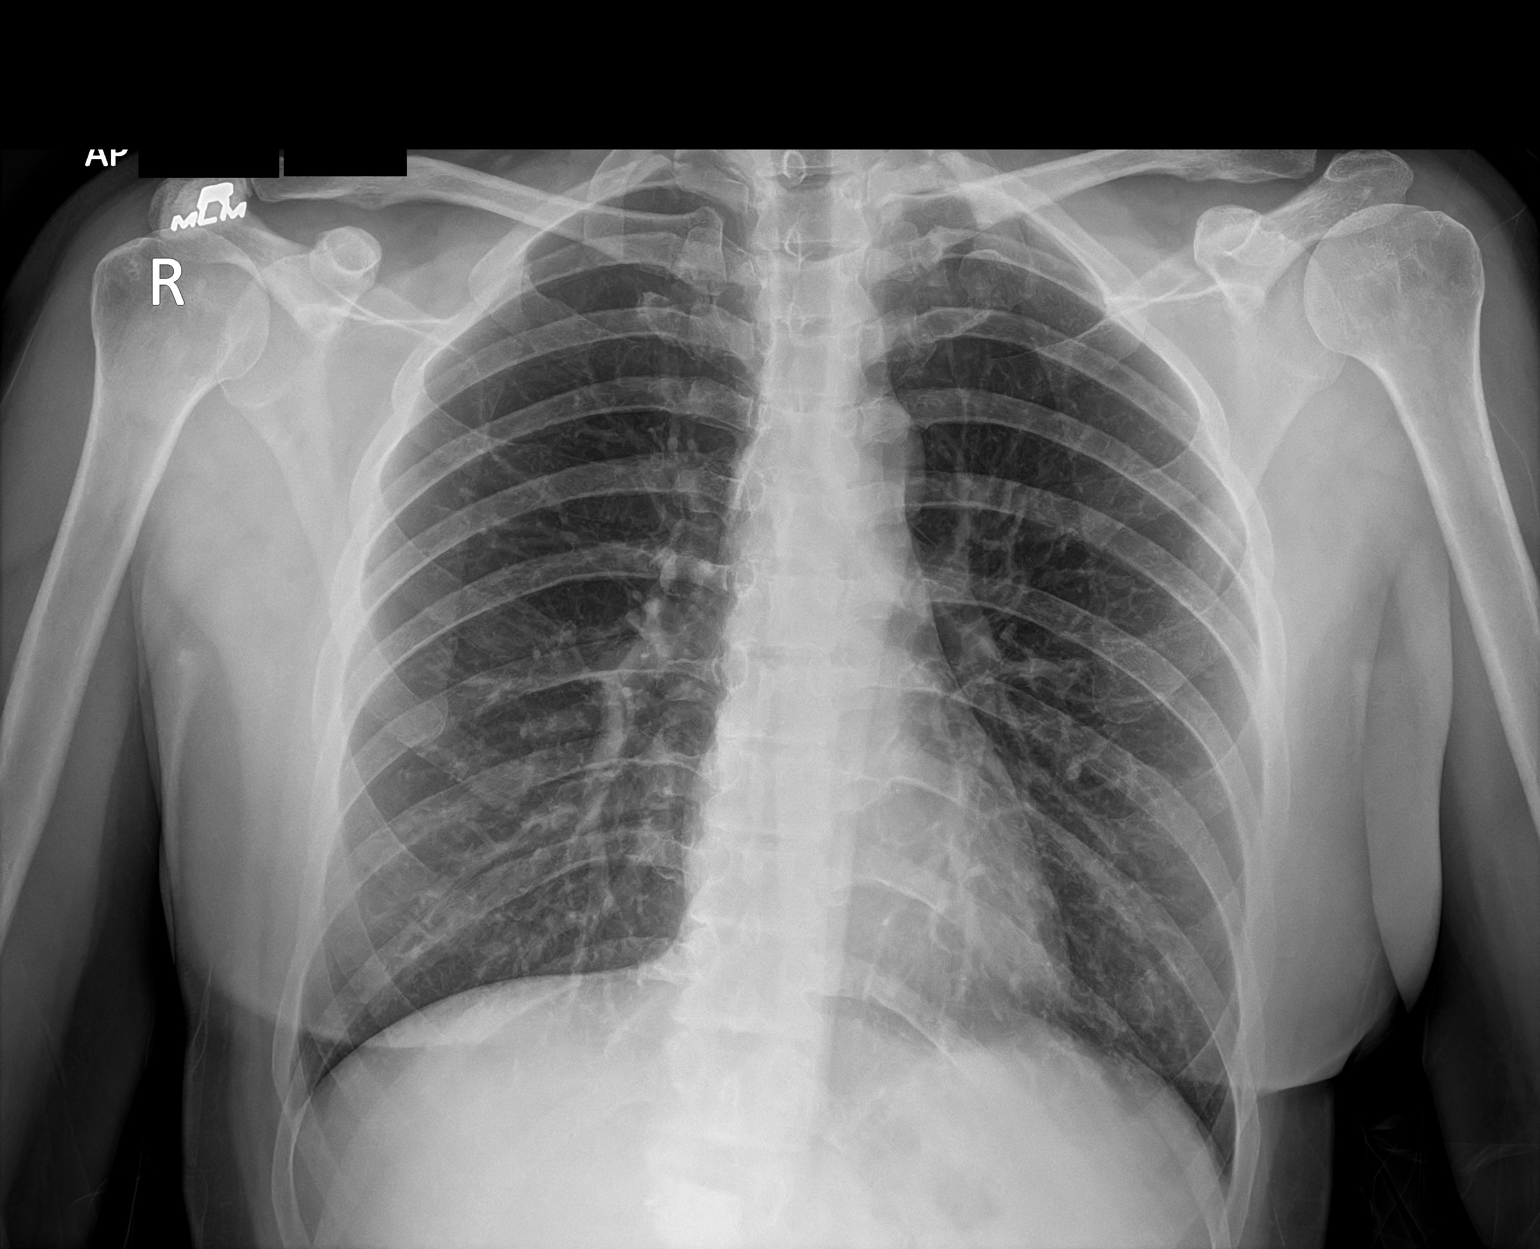

[1 of 1 positions shown; findings below may reference images not displayed]

FINDINGS: Normal heart size, mediastinal contours, and pulmonary vascularity.

Lungs clear.

No pulmonary infiltrate, pleural effusion, or pneumothorax.

No acute osseous findings.
IMPRESSION: No acute abnormalities.

## 2020-11-08 MED ORDER — BENZONATATE 100 MG PO CAPS
100.0000 mg | ORAL_CAPSULE | Freq: Three times a day (TID) | ORAL | 0 refills | Status: DC
  Filled 2020-11-08: qty 21, 7d supply, fill #0

## 2020-11-08 NOTE — ED Provider Notes (Signed)
[facility] EMERGENCY DEPT Provider Note   CSN: [id number] Arrival date & time: 11/08/20  1131     History Chief Complaint  Patient presents with   Cough    Tammie Frederick is a 40 y.o. female.  Presents to ER with concern for cough.  Patient reports that she has been having cough for about 2 days.  Started becoming more productive yesterday.  Some thick and yellow cough.  Had reported some shortness of breath to triage RN but when evaluated patient she denied any shortness of breath.  Additionally denies any chest pain.  Had some chills.  No fevers.  Took at home COVID test on Saturday that was reportedly negative.  Denies any chronic medical problems.  HPI     Past Medical History:  Diagnosis Date   Anxiety    Asthma    occasional inhaler use   Back injury    Broken finger    H/O sinus surgery    History of back surgery    HPV (human papilloma virus) anogenital infection    Preterm labor    PTL starting @ 28 wks   Vaginal Pap smear, abnormal     Patient Active Problem List   Diagnosis Date Noted   AMA (advanced maternal age) multigravida 35+ 06/30/2019   Muscle spasm 04/15/2019   Threatened preterm labor 04/15/2019   IUGR (intrauterine growth restriction) affecting care of mother 04/15/2019   Short cervical length during pregnancy 04/14/2019   LGSIL on Pap smear of cervix 04/14/2019   H/O cone biopsy of cervix 03/27/2009    Past Surgical History:  Procedure Laterality Date   BACK SURGERY     CERVICAL BIOPSY  W/ LOOP ELECTRODE EXCISION     FRACTURE SURGERY     fixation of fracture using dynamic compression system   NASAL SINUS SURGERY     multiple     OB History     Gravida  2   Para  1   Term  1   Preterm  0   AB  1   Living  1      SAB  1   IAB  0   Ectopic  0   Multiple  0   Live Births  1           Family History  Problem Relation Age of Onset   Breast cancer Maternal Grandmother    Lung cancer Maternal  Grandmother    Diabetes Paternal Grandfather     Social History   Tobacco Use   Smoking status: Never   Smokeless tobacco: Never  Vaping Use   Vaping Use: Never used  Substance Use Topics   Alcohol use: Not Currently    Comment: socially   Drug use: Never    Home Medications Prior to Admission medications   Medication Sig Start Date End Date Taking? Authorizing Provider  benzonatate (TESSALON) 100 MG capsule Take 1 capsule (100 mg total) by mouth every 8 (eight) hours. 11/08/20  Yes Milagros Loll, MD  acetaminophen (TYLENOL) 500 MG tablet Take 500-1,000 mg by mouth every 6 (six) hours as needed for mild pain, moderate pain or headache.    [provider]  albuterol (VENTOLIN HFA) 108 (90 Base) MCG/ACT inhaler Inhale 2 puffs into the lungs every 4 (four) hours as needed for wheezing or shortness of breath.    [provider]  benzocaine-Menthol (DERMOPLAST) 20-0.5 % AERO Apply 1 application topically as needed for irritation (perineal discomfort). 07/02/19  Essie Hart, MD  famotidine (PEPCID) 20 MG tablet Take 20 mg by mouth daily as needed for heartburn or indigestion.    [provider]  ibuprofen (ADVIL) 600 MG tablet Take 1 tablet (600 mg total) by mouth every 6 (six) hours. 07/02/19   Essie Hart, MD  Prenatal Vit-Fe Fumarate-FA (PRENATAL MULTIVITAMIN) TABS tablet Take 1 tablet by mouth daily at 12 noon.    [provider]    Allergies    Sulfa antibiotics, Cauliflower [brassica oleracea], Dilaudid [hydromorphone hcl], Latex, Zithromax [azithromycin], and Zyrtec [cetirizine]  Review of Systems   Review of Systems  Constitutional:  Negative for chills and fever.  HENT:  Positive for congestion. Negative for ear pain and sore throat.   Eyes:  Negative for pain and visual disturbance.  Respiratory:  Positive for cough. Negative for shortness of breath.   Cardiovascular:  Negative for chest pain and palpitations.  Gastrointestinal:   Negative for abdominal pain and vomiting.  Genitourinary:  Negative for dysuria and hematuria.  Musculoskeletal:  Positive for arthralgias. Negative for back pain.  Skin:  Negative for color change and rash.  Neurological:  Negative for seizures and syncope.  All other systems reviewed and are negative.  Physical Exam Updated Vital Signs BP 119/63 (BP Location: Right Arm)   Pulse 100   Temp 99.1 F (37.3 C)   Resp 16   Ht 5\' 6"  (1.676 m)   Wt 72.6 kg   LMP 10/25/2020   SpO2 99%   BMI 25.82 kg/m   Physical Exam Vitals and nursing note reviewed.  Constitutional:      General: She is not in acute distress.    Appearance: She is well-developed.  HENT:     Head: Normocephalic and atraumatic.  Eyes:     Conjunctiva/sclera: Conjunctivae normal.  Cardiovascular:     Rate and Rhythm: Normal rate and regular rhythm.     Heart sounds: No murmur heard. Pulmonary:     Effort: Pulmonary effort is normal. No respiratory distress.     Breath sounds: Normal breath sounds.  Abdominal:     Palpations: Abdomen is soft.     Tenderness: There is no abdominal tenderness.  Musculoskeletal:     Cervical back: Neck supple.  Skin:    General: Skin is warm and dry.  Neurological:     General: No focal deficit present.     Mental Status: She is alert.  Psychiatric:        Mood and Affect: Mood normal.    ED Results / Procedures / Treatments   Labs (all labs ordered are listed, but only abnormal results are displayed) Labs Reviewed  RESP PANEL BY RT-PCR (FLU A&B, COVID) ARPGX2 - Abnormal; Notable for the following components:      Result Value   SARS Coronavirus 2 by RT PCR POSITIVE (*)    All other components within normal limits    EKG None  Radiology DG Chest Portable 1 View  Result Date: 11/08/2020 CLINICAL DATA:  Cough for 2 days, became productive yesterday, thick yellow sputum, some shortness of breath, dizziness, and body aches, question pneumonia EXAM: PORTABLE CHEST 1  VIEW COMPARISON:  Portable exam 1226 hours without priors for comparison FINDINGS: Normal heart size, mediastinal contours, and pulmonary vascularity. Lungs clear. No pulmonary infiltrate, pleural effusion, or pneumothorax. No acute osseous findings. IMPRESSION: No acute abnormalities. Electronically Signed   By: 11/10/2020 M.D.   On: 11/08/2020 12:36    Procedures Procedures   Medications  Ordered in ED Medications - No data to display  ED Course  I have reviewed the triage vital signs and the nursing notes.  Pertinent labs & imaging results that were available during my care of the patient were reviewed by me and considered in my medical decision making (see chart for details).    MDM Rules/Calculators/A&P                           40 year old lady presents to ER with concern for cough, congestion.  On exam patient appears well in no acute distress.  Initially noted slight tachycardia but this improved without intervention.  Lungs are clear.  CXR negative.  Suspect acute viral process.  We will send COVID and influenza testing.  At present believe she is appropriate for discharge and outpatient management.  Recommend supportive care for now.  Instructed patient to check MyChart for COVID results.  Rogelio Winbush was evaluated in Emergency Department on 11/09/2020 for the symptoms described in the history of present illness. She was evaluated in the context of the global COVID-19 pandemic, which necessitated consideration that the patient might be at risk for infection with the SARS-CoV-2 virus that causes COVID-19. Institutional protocols and algorithms that pertain to the evaluation of patients at risk for COVID-19 are in a state of rapid change based on information released by regulatory bodies including the CDC and federal and state organizations. These policies and algorithms were followed during the patient's care in the ED.  Final Clinical Impression(s) / ED Diagnoses Final diagnoses:   Viral upper respiratory illness  Cough  Suspected COVID-19 virus infection    Rx / DC Orders ED Discharge Orders          Ordered    benzonatate (TESSALON) 100 MG capsule  Every 8 hours        11/08/20 1255             Milagros Loll, MD 11/09/20 [phone]

## 2020-11-08 NOTE — ED Triage Notes (Signed)
Pt reports approximately 2 day history of cough.  Cough became productive yesterday, sputum was thick and yellow.  Reports some shortness of breath and dizziness, and body aches.  Denies fever.  Did home Covid test on Saturday which was negative.

## 2020-11-08 NOTE — Discharge Instructions (Addendum)
Take Tessalon Perles as needed for the cough.  Follow-up with your primary care doctor.  If you develop any chest pain, difficulty in breathing, come back to the emergency room for reassessment.  Please check MyChart for results regarding your COVID test.  If positive you should follow isolation precautions per CDC guidelines.

## 2020-12-27 ENCOUNTER — Encounter (HOSPITAL_BASED_OUTPATIENT_CLINIC_OR_DEPARTMENT_OTHER): Payer: Self-pay | Admitting: Nurse Practitioner

## 2020-12-27 ENCOUNTER — Ambulatory Visit (HOSPITAL_BASED_OUTPATIENT_CLINIC_OR_DEPARTMENT_OTHER): Payer: 59 | Admitting: Nurse Practitioner

## 2020-12-27 ENCOUNTER — Other Ambulatory Visit: Payer: Self-pay

## 2020-12-27 VITALS — BP 116/76 | HR 67 | Ht 66.0 in | Wt 157.8 lb

## 2020-12-27 DIAGNOSIS — M5136 Other intervertebral disc degeneration, lumbar region: Secondary | ICD-10-CM

## 2020-12-27 DIAGNOSIS — Z23 Encounter for immunization: Secondary | ICD-10-CM

## 2020-12-27 DIAGNOSIS — M5030 Other cervical disc degeneration, unspecified cervical region: Secondary | ICD-10-CM

## 2020-12-27 DIAGNOSIS — G8929 Other chronic pain: Secondary | ICD-10-CM

## 2020-12-27 DIAGNOSIS — Z0000 Encounter for general adult medical examination without abnormal findings: Secondary | ICD-10-CM | POA: Insufficient documentation

## 2020-12-27 DIAGNOSIS — M5441 Lumbago with sciatica, right side: Secondary | ICD-10-CM

## 2020-12-27 DIAGNOSIS — R42 Dizziness and giddiness: Secondary | ICD-10-CM

## 2020-12-27 DIAGNOSIS — F411 Generalized anxiety disorder: Secondary | ICD-10-CM

## 2020-12-27 DIAGNOSIS — Z981 Arthrodesis status: Secondary | ICD-10-CM

## 2020-12-27 DIAGNOSIS — M5010 Cervical disc disorder with radiculopathy, unspecified cervical region: Secondary | ICD-10-CM

## 2020-12-27 DIAGNOSIS — Z8781 Personal history of (healed) traumatic fracture: Secondary | ICD-10-CM | POA: Diagnosis not present

## 2020-12-27 MED ORDER — METHOCARBAMOL 750 MG PO TABS: 750.0000 mg | ORAL_TABLET | Freq: Four times a day (QID) | ORAL | 3 refills | Status: DC | PRN

## 2020-12-27 MED ORDER — CELECOXIB 100 MG PO CAPS
100.0000 mg | ORAL_CAPSULE | Freq: Two times a day (BID) | ORAL | 6 refills | Status: DC
Start: 2020-12-27 — End: 2023-06-07

## 2020-12-27 NOTE — Patient Instructions (Addendum)
Recommendations from today's visit: I will send the orders for the MRI in for you. They will call you to schedule this and let you know the location.  If you can, send the results of your most recent labs to me.  We will plan to get a physical exam in for you once we get the MRI results back so we can go over those at the same time as the exam. Meclizine (bonine) can be helpful for vertigo at times - if you experience an episode you can try this to see if it is helpful to control your symptoms.    Information on diet, exercise, and health maintenance recommendations are listed below. This is information to help you be sure you are on track for optimal health and monitoring.   Please look over this and let us know if you have any questions or if you have completed any of the health maintenance outside of [place] so that we can be sure your records are up to date.  ___________________________________________________________  Thank you for choosing [place] [facility] for your Primary Care needs. I am excited for the opportunity to partner with you to meet your health care goals. It was a pleasure meeting you today!  I am an Adult-Geriatric Nurse Practitioner with a background in caring for patients for more than 20 years. I provide primary care and sports medicine services to patients age 93 and older within this office. I am also the director of the APP Fellowship with [facility].   I am passionate about providing the best service to you through preventive medicine and supportive care. I consider you a part of the medical team and value your input. I work diligently to ensure that you are heard and your needs are met in a safe and effective manner. I want you to feel comfortable with me as your provider and want you to know that your health concerns are important to me.  For your information, our office hours are Monday- Friday 8:00 AM - 5:00 PM At this time I am not  in the office on Wednesdays.  If you have questions or concerns, please call our office at [phone] or send [place] a MyChart message and we will respond as quickly as possible.   For all urgent or time sensitive needs we ask that you please call the office to avoid delays. MyChart is not constantly monitored and replies may take up to 72 business hours.  MyChart Policy: MyChart allows for you to see your visit notes, after visit summary, provider recommendations, lab and tests results, make an appointment, request refills, and contact your provider or the office for non-urgent questions or concerns. Providers are seeing patients during normal business hours and do not have built in time to review MyChart messages.  We ask that you allow a minimum of 4 business days for responses to [employer]. For this reason, please do not send urgent requests through [place]. Please call the office at [phone]. Complex MyChart concerns may require a visit. Your provider may request you schedule a virtual or in person visit to ensure we are providing the best care possible. MyChart messages sent after 4:00 PM on Friday will not be received by the provider until Monday morning.    Lab and Test Results: You will receive your lab and test results on MyChart as soon as they are completed and results have been sent by the lab or testing facility. Due to  this service, you will receive your results BEFORE your provider.  I review lab and tests results each morning prior to seeing patients. Some results require collaboration with other providers to ensure you are receiving the most appropriate care. For this reason, we ask that you please allow a minimum of 4 business days for your provider to receive and review lab and test results and contact you about these.  Most lab and test result comments from the provider will be sent through [place]. Your provider may recommend changes to the plan of care, follow-up  visits, repeat testing, ask questions, or request an office visit to discuss these results. You may reply directly to this message or call the office at [phone] to provide information for the provider or set up an appointment. In some instances, you will be called with test results and recommendations. Please let us know if this is preferred and we will make note of this in your chart to provide this for you.    If you have not heard a response to your lab or test results in 72 business hours, please call the office to let us know.   After Hours: For all non-emergency after hours needs, please call the office at [phone] and select the option to reach the on-call provider service. On-call services are shared between multiple [place] offices and therefore it will not be possible to speak directly with your provider. On-call providers may provide medical advice and recommendations, but are unable to provide refills for maintenance medications.  For all emergency or urgent medical needs after normal business hours, we recommend that you seek care at the closest Urgent Care or Emergency Department to ensure appropriate treatment in a timely manner.  [facility] [place] has a 24 hour emergency room located on the ground floor for your convenience.    Please do not hesitate to reach out to [place] with concerns.   Thank you, again, for choosing me as your health care partner. I appreciate your trust and look forward to learning more about you.   Worthy Keeler, DNP, AGNP-c ___________________________________________________________  Health Maintenance Recommendations Screening Testing Mammogram Every 1 -2 years based on history and risk factors Starting at age 26 Pap Smear Ages 21-39 every 3 years Ages 29-65 every 5 years with HPV testing More frequent testing may be required based on results and history Colon Cancer Screening Every 1-10 years based on test performed, risk  factors, and history Starting at age 10 Bone Density Screening Every 2-10 years based on history Starting at age 25 for women Recommendations for men differ based on medication usage, history, and risk factors AAA Screening One time ultrasound Men 78-90 years old who have every smoked Lung Cancer Screening Low Dose Lung CT every 12 months Age 55-80 years with a 30 pack-year smoking history who still smoke or who have quit within the last 15 years  Screening Labs Routine  Labs: Complete Blood Count (CBC), Complete Metabolic Panel (CMP), Cholesterol (Lipid Panel) Every 6-12 months based on history and medications May be recommended more frequently based on current conditions or previous results Hemoglobin A1c Lab Every 3-12 months based on history and previous results Starting at age 52 or earlier with diagnosis of diabetes, high cholesterol, BMI >26, and/or risk factors Frequent monitoring for patients with diabetes to ensure blood sugar control Thyroid Panel (TSH w/ T3 & T4) Every 6 months based on history, symptoms, and risk factors May be repeated more often if on  medication HIV One time testing for all patients 13 and older May be repeated more frequently for patients with increased risk factors or exposure Hepatitis C One time testing for all patients 72 and older May be repeated more frequently for patients with increased risk factors or exposure Gonorrhea, Chlamydia Every 12 months for all sexually active persons 13-24 years Additional monitoring may be recommended for those who are considered high risk or who have symptoms PSA Men 5-57 years old with risk factors Additional screening may be recommended from age 51-69 based on risk factors, symptoms, and history  Vaccine Recommendations Tetanus Booster All adults every 10 years Flu Vaccine All patients 6 months and older every year COVID Vaccine All patients 12 years and older Initial dosing with booster May  recommend additional booster based on age and health history HPV Vaccine 2 doses all patients age 4-26 Dosing may be considered for patients over 26 Shingles Vaccine (Shingrix) 2 doses all adults 38 years and older Pneumonia (Pneumovax 23) All adults 53 years and older May recommend earlier dosing based on health history Pneumonia (Prevnar 63) All adults 80 years and older Dosed 1 year after Pneumovax 23  Additional Screening, Testing, and Vaccinations may be recommended on an individualized basis based on family history, health history, risk factors, and/or exposure.  __________________________________________________________  Diet Recommendations for All Patients  I recommend that all patients maintain a diet low in saturated fats, carbohydrates, and cholesterol. While this can be challenging at first, it is not impossible and small changes can make big differences.  Things to try: Decreasing the amount of soda, sweet tea, and/or juice to one or less per day and replace with water While water is always the first choice, if you do not like water you may consider adding a water additive without sugar to improve the taste other sugar free drinks Replace potatoes with a brightly colored vegetable at dinner Use healthy oils, such as canola oil or olive oil, instead of butter or hard margarine Limit your bread intake to two pieces or less a day Replace regular pasta with low carb pasta options Bake, broil, or grill foods instead of frying Monitor portion sizes  Eat smaller, more frequent meals throughout the day instead of large meals  An important thing to remember is, if you love foods that are not great for your health, you don't have to give them up completely. Instead, allow these foods to be a reward when you have done well. Allowing yourself to still have special treats every once in a while is a nice way to tell yourself thank you for working hard to keep yourself healthy.    Also remember that every day is a new day. If you have a bad day and "fall off the wagon", you can still climb right back up and keep moving along on your journey!  We have resources available to help you!  Some websites that may be helpful include: www.http://carter.biz/  Www.VeryWellFit.com _____________________________________________________________  Activity Recommendations for All Patients  I recommend that all adults get at least 20 minutes of moderate physical activity that elevates your heart rate at least 5 days out of the week.  Some examples include: Walking or jogging at a pace that allows you to carry on a conversation Cycling (stationary bike or outdoors) Water aerobics Yoga Weight lifting Dancing If physical limitations prevent you from putting stress on your joints, exercise in a pool or seated in a chair are excellent options.  Do determine  your MAXIMUM heart rate for activity: YOUR AGE - 220 = MAX HeartRate   Remember! Do not push yourself too hard.  Start slowly and build up your pace, speed, weight, time in exercise, etc.  Allow your body to rest between exercise and get good sleep. You will need more water than normal when you are exerting yourself. Do not wait until you are thirsty to drink. Drink with a purpose of getting in at least 8, 8 ounce glasses of water a day plus more depending on how much you exercise and sweat.    If you begin to develop dizziness, chest pain, abdominal pain, jaw pain, shortness of breath, headache, vision changes, lightheadedness, or other concerning symptoms, stop the activity and allow your body to rest. If your symptoms are severe, seek emergency evaluation immediately. If your symptoms are concerning, but not severe, please let us know so that we can recommend further evaluation.   ________________________________________________________________

## 2020-12-27 NOTE — Progress Notes (Signed)
Orma Render, DNP, AGNP-c [facility] [address]  [place], [place] [address] [phone]  New patient visit   Patient: Tammie Frederick   DOB: 22-Jun-1980   40 y.o. Female  MRN: [id number] Visit Date: 12/27/2020  Patient Care Team: Shandra Szymborski, Coralee Pesa, NP as PCP - General (Nurse Practitioner)  Today's healthcare provider: Orma Render, NP   Chief Complaint  Patient presents with   Establish Care    Patient states she had a compression fracture at L1.  She wants to make sure she does not have a bulging disc.   Subjective    Tammie Frederick is a 40 y.o. female who presents today as a new patient to establish care.  HPI  Kaela reports history of a spinal compression fracture at L1 in 2012 after a horseback riding accident.  She underwent kyphoplasty for repair at that time.  Since then, she has been troubled with chronic back pain. She has been on celebrex and robaxin in the past, which have helped manage her pain quite well in addition to chiropractic care.  She moved about 3 years ago and had not established care with a new provider and has been out of medication for a while now.  She reports that her pain is no controllable with exercise, chiropractic care, and OTC medications at this point.   With her last MRI in 2017 reports showing bulging disc at C3-C7 and L1-S2. She reports that her R side is worse than the L.  She has problems in the hip, knee, and ankle on the R side She reports consistent sciatic pain down the posterior R thigh.  The pain is worsened with activity and carrying her son, who weighs about 30 pounds. No GU involvement. No difficulties walking, lifting, carrying aside from pain.  She is experiencing radicular symptoms in her upper extremities, which has her concerned for worsening cervical spine changes.  She is also experiencing radicular symptoms in her lower extremities, which has her concerned for  worsening lumbar spine changes.   She would like to have imaging done today to ensure that more damage is not present given the level of her pain.   She endorses moderate anxiety, which is situational.  She is going through a custody battle at this time and feels this experience has exacerbated her condition She is seeing a therapist, but not on any medication at this time She would like to avoid medications for now.   She reports a history of chronic sinusitis with previous sinus surgery.  She does endorse intermittent dizziness, for which she has had for several years Work-ups have been unrevealing for a cause She endorses no particular activity or situation to trigger dizziness. Episodes last only a few minutes and resolve. She does endorse occasional palpitations with the dizziness, but feels this is related to anxiety.   Past Medical History:  Diagnosis Date   AMA (advanced maternal age) multigravida 35+ 06/30/2019   Anxiety    Asthma    occasional inhaler use   Back injury    Broken finger    H/O sinus surgery    History of back surgery    HPV (human papilloma virus) anogenital infection    IUGR (intrauterine growth restriction) affecting care of mother 04/15/2019   Preterm labor    PTL starting @ 28 wks   Short cervical length during pregnancy 04/14/2019   Threatened preterm labor 04/15/2019   Vaginal Pap smear, abnormal  Past Surgical History:  Procedure Laterality Date   BACK SURGERY     CERVICAL BIOPSY  W/ LOOP ELECTRODE EXCISION     FRACTURE SURGERY     fixation of fracture using dynamic compression system   NASAL SINUS SURGERY     multiple   Family Status  Relation Name Status   Mother  Alive   Father  Alive   MGM  (Not Specified)   PGF  (Not Specified)   Family History  Problem Relation Age of Onset   Hyperlipidemia Mother    Depression Father    Anxiety disorder Father    Breast cancer Maternal Grandmother    Lung cancer Maternal Grandmother     Diabetes Paternal Grandfather    Social History   Socioeconomic History   Marital status: Single    Spouse name: Not on file   Number of children: Not on file   Years of education: Not on file   Highest education level: Not on file  Occupational History   Occupation: human resources    Comment: [employer]  Tobacco Use   Smoking status: Never   Smokeless tobacco: Never  Vaping Use   Vaping Use: Never used  Substance and Sexual Activity   Alcohol use: Not Currently    Comment: socially   Drug use: Never   Sexual activity: Yes    Birth control/protection: I.U.D.  Other Topics Concern   Not on file  Social History Narrative   Not on file   Social Determinants of Health   Financial Resource Strain: Not on file  Food Insecurity: Not on file  Transportation Needs: Not on file  Physical Activity: Not on file  Stress: Not on file  Social Connections: Not on file   Outpatient Medications Prior to Visit  Medication Sig   albuterol (VENTOLIN HFA) 108 (90 Base) MCG/ACT inhaler Inhale 2 puffs into the lungs every 4 (four) hours as needed for wheezing or shortness of breath.   loratadine (CLARITIN) 10 MG tablet Take by mouth.   paragard intrauterine copper IUD IUD 1 each by Intrauterine route once.   [DISCONTINUED] ibuprofen (ADVIL) 600 MG tablet Take 1 tablet (600 mg total) by mouth every 6 (six) hours.   [DISCONTINUED] acetaminophen (TYLENOL) 500 MG tablet Take 500-1,000 mg by mouth every 6 (six) hours as needed for mild pain, moderate pain or headache.   [DISCONTINUED] benzocaine-Menthol (DERMOPLAST) 20-0.5 % AERO Apply 1 application topically as needed for irritation (perineal discomfort).   [DISCONTINUED] benzonatate (TESSALON) 100 MG capsule Take 1 capsule (100 mg total) by mouth every 8 (eight) hours.   [DISCONTINUED] etonogestrel-ethinyl estradiol (NUVARING) 0.12-0.015 MG/24HR vaginal ring Place vaginally. (Patient not taking: Reported on 12/27/2020)   [DISCONTINUED] famotidine  (PEPCID) 20 MG tablet Take 20 mg by mouth daily as needed for heartburn or indigestion.   [DISCONTINUED] Prenatal Vit-Fe Fumarate-FA (PRENATAL MULTIVITAMIN) TABS tablet Take 1 tablet by mouth daily at 12 noon.   No facility-administered medications prior to visit.   Allergies  Allergen Reactions   Sulfa Antibiotics Nausea And Vomiting and Other (See Comments)    Severe Stomach pain- Sulfa Abx only    Cetirizine Rash   Latex Swelling   Cauliflower [Brassica Oleracea] Itching    Itching of the mouth and throat   Azithromycin Other (See Comments)    Stomach pains    Hydromorphone Hcl Itching    Face itching     Immunization History  Administered Date(s) Administered   Influenza,inj,Quad PF,6+ Mos 01/15/2017, 12/27/2020  MMR 10/21/1991    Health Maintenance  Topic Date Due   COVID-19 Vaccine (1) Never done   Hepatitis C Screening  Never done   TETANUS/TDAP  Never done   PAP SMEAR-Modifier  09/16/2023   INFLUENZA VACCINE  Completed   HIV Screening  Completed   HPV VACCINES  Aged Out    Patient Care Team: Alyxis Grippi, Coralee Pesa, NP as PCP - General (Nurse Practitioner)  Review of Systems All review of systems negative except what is listed in the HPI    Objective    BP 116/76   Pulse 67   Ht _0  (1.676 m)   Wt 157 lb 12.8 oz (71.6 kg)   SpO2 96%   BMI 25.47 kg/m  Physical Exam Vitals and nursing note reviewed.  Constitutional:      Appearance: Normal appearance. She is normal weight.  HENT:     Head: Normocephalic.  Eyes:     Extraocular Movements: Extraocular movements intact.     Conjunctiva/sclera: Conjunctivae normal.     Pupils: Pupils are equal, round, and reactive to light.  Cardiovascular:     Rate and Rhythm: Normal rate and regular rhythm.     Pulses: Normal pulses.     Heart sounds: Normal heart sounds.  Pulmonary:     Effort: Pulmonary effort is normal.     Breath sounds: Normal breath sounds.  Abdominal:     General: Bowel sounds are normal.      Palpations: Abdomen is soft.     Tenderness: There is no right CVA tenderness or left CVA tenderness.  Musculoskeletal:        General: Tenderness and signs of injury present.     Cervical back: Tenderness present.     Right lower leg: No edema.     Left lower leg: No edema.     Comments: Tenderness throughout the spine with spasms noted in cervical, thoracic, and lumbar areas.  Pain to light palpation of thoracic and lumbar spine. Mild weakness in upper and lower extremities with R>L   Skin:    General: Skin is warm and dry.     Capillary Refill: Capillary refill takes less than 2 seconds.  Neurological:     General: No focal deficit present.     Mental Status: She is alert and oriented to person, place, and time.     Cranial Nerves: No cranial nerve deficit.     Sensory: No sensory deficit.     Motor: Weakness present.     Coordination: Coordination normal.     Gait: Gait normal.  Psychiatric:        Mood and Affect: Mood normal.        Behavior: Behavior normal.        Thought Content: Thought content normal.        Judgment: Judgment normal.    Depression Screen PHQ 2/9 Scores 12/27/2020  PHQ - 2 Score 0  PHQ- 9 Score 2   No results found for any visits on 12/27/20.  Assessment & Plan      Problem List Items Addressed This Visit     Chronic back pain    Traumatic fracture T1 with known bulging in both cervical and lumbar spine. Radicular symptoms are worsenin. Previous treatment with Celebrex and Robaxin quite effective. Will restart today. MRI of spine ordered. No red flags at this time Recommend awaiting results to determine next follow-up steps      Relevant Medications   celecoxib (CELEBREX)  100 MG capsule   methocarbamol (ROBAXIN-750) 750 MG tablet   Other Relevant Orders   MR Thoracic Spine Wo Contrast   MR Lumbar Spine Wo Contrast   History of vertebral fracture    Kyphoplasty to T1 in 2012 after horseback riding injury. Last check of device  2017 New symptoms are present, which poses concerns for movement vs age related changes associated with the injury Will obtain MRI      Relevant Medications   celecoxib (CELEBREX) 100 MG capsule   methocarbamol (ROBAXIN-750) 750 MG tablet   Other Relevant Orders   MR Cervical Spine Wo Contrast   MR Thoracic Spine Wo Contrast   MR Lumbar Spine Wo Contrast   Encounter for medical examination to establish care - Primary    Review of current and past medical history, social history, medication, and family history.  Review of care gaps and health maintenance recommendations.  Records from recent providers to be requested if not available in Chart Review or Care Everywhere.  Recommendations for health maintenance, diet, and exercise provided.  Labs today: N/A HM Recommendations: Flu vaccine CPE due: Near future- will plan to f/u when MRI is complete       Cervical disc disorder with radiculopathy    Concerns for worsening malaignment and protrusion given worsening radicular symptoms. History of traumatic injury with thoracic fracture and surgery. Previous MRI 2017 showed bulging C3-C7. No noted weakness on exam today. Concerns that dizziness may be related.  MRI ordered today      Relevant Medications   celecoxib (CELEBREX) 100 MG capsule   methocarbamol (ROBAXIN-750) 750 MG tablet   Other Relevant Orders   MR Cervical Spine Wo Contrast   Arthrodesis status    Monitor T1 kyphoplasty  New symptoms present No alarm symptoms present today MRI ordered      Relevant Orders   MR Thoracic Spine Wo Contrast   Bulging of cervical intervertebral disc    MRI 2017-: C3-C7 bulging in setting of traumatic back injury.  New and worsening radicular symptoms present.  No new injury. No red flags today. Will order MRI for re-evaluation before proceeding with recommendations.       Relevant Orders   MR Cervical Spine Wo Contrast   Bulging of lumbar intervertebral disc    MRI 2017:  L1-S2 in setting of traumatic back injury New and worsening radicular symptoms Will order MRI today No red flags at this time Pulses intact.  Weakness is present bilaterally.  Tenderness present over thoracic and lumbar spine with gentle pressure.  Will await imaging results for further recommendations.       Relevant Orders   MR Lumbar Spine Wo Contrast   GAD (generalized anxiety disorder)    Situational and well controlled at this time Recommend continue with therapy We can consider SSRI for treatment addition if symptoms worsen or become uncontrolled. Patient will reach out if she wishes to discuss this further      Dizziness    Intermittent with no known triggers Appears work-up has been completed in the past with no known cause Consider possible inner ear etiology vs anxiety vs spinal triggers Will obtain MRI of spine today Effusion of ears bilaterally today, consistent with inner ear condition She will let us know if this reoccurs or worsens.       Other Visit Diagnoses     Need for immunization against influenza       Relevant Orders   Flu Vaccine QUAD 65moIM (Fluarix, Fluzone &  Alfiuria Quad PF) (Completed)        No follow-ups on file.      Mila Pair, Coralee Pesa, NP, DNP, AGNP-C [facility] [place]

## 2020-12-28 DIAGNOSIS — M5136 Other intervertebral disc degeneration, lumbar region: Secondary | ICD-10-CM | POA: Insufficient documentation

## 2020-12-28 DIAGNOSIS — Z981 Arthrodesis status: Secondary | ICD-10-CM | POA: Insufficient documentation

## 2020-12-28 DIAGNOSIS — F411 Generalized anxiety disorder: Secondary | ICD-10-CM | POA: Insufficient documentation

## 2020-12-28 DIAGNOSIS — M5030 Other cervical disc degeneration, unspecified cervical region: Secondary | ICD-10-CM | POA: Insufficient documentation

## 2020-12-28 DIAGNOSIS — R42 Dizziness and giddiness: Secondary | ICD-10-CM | POA: Insufficient documentation

## 2020-12-28 NOTE — Assessment & Plan Note (Signed)
MRI 2017-: C3-C7 bulging in setting of traumatic back injury.  New and worsening radicular symptoms present.  No new injury. No red flags today. Will order MRI for re-evaluation before proceeding with recommendations.

## 2020-12-28 NOTE — Assessment & Plan Note (Signed)
MRI 2017: L1-S2 in setting of traumatic back injury New and worsening radicular symptoms Will order MRI today No red flags at this time Pulses intact.  Weakness is present bilaterally.  Tenderness present over thoracic and lumbar spine with gentle pressure.  Will await imaging results for further recommendations.

## 2020-12-28 NOTE — Assessment & Plan Note (Signed)
Intermittent with no known triggers Appears work-up has been completed in the past with no known cause Consider possible inner ear etiology vs anxiety vs spinal triggers Will obtain MRI of spine today Effusion of ears bilaterally today, consistent with inner ear condition She will let us know if this reoccurs or worsens.

## 2020-12-28 NOTE — Assessment & Plan Note (Signed)
Concerns for worsening malaignment and protrusion given worsening radicular symptoms. History of traumatic injury with thoracic fracture and surgery. Previous MRI 2017 showed bulging C3-C7. No noted weakness on exam today. Concerns that dizziness may be related.  MRI ordered today

## 2020-12-28 NOTE — Assessment & Plan Note (Signed)
Situational and well controlled at this time Recommend continue with therapy We can consider SSRI for treatment addition if symptoms worsen or become uncontrolled. Patient will reach out if she wishes to discuss this further

## 2020-12-28 NOTE — Assessment & Plan Note (Signed)
Kyphoplasty to T1 in 2012 after horseback riding injury. Last check of device 2017 New symptoms are present, which poses concerns for movement vs age related changes associated with the injury Will obtain MRI

## 2020-12-28 NOTE — Assessment & Plan Note (Signed)
Traumatic fracture T1 with known bulging in both cervical and lumbar spine. Radicular symptoms are worsenin. Previous treatment with Celebrex and Robaxin quite effective. Will restart today. MRI of spine ordered. No red flags at this time Recommend awaiting results to determine next follow-up steps

## 2020-12-28 NOTE — Assessment & Plan Note (Signed)
Monitor T1 kyphoplasty  New symptoms present No alarm symptoms present today MRI ordered

## 2020-12-28 NOTE — Assessment & Plan Note (Signed)
Review of current and past medical history, social history, medication, and family history.  Review of care gaps and health maintenance recommendations.  Records from recent providers to be requested if not available in Chart Review or Care Everywhere.  Recommendations for health maintenance, diet, and exercise provided.  Labs today: N/A HM Recommendations: Flu vaccine CPE due: Near future- will plan to f/u when MRI is complete

## 2021-01-20 ENCOUNTER — Ambulatory Visit
Admission: RE | Admit: 2021-01-20 | Discharge: 2021-01-20 | Disposition: A | Discharge status: Home / Self Care | Payer: 59 | Source: Ambulatory Visit | Attending: Nurse Practitioner | Admitting: Nurse Practitioner

## 2021-01-20 ENCOUNTER — Other Ambulatory Visit: Payer: Self-pay

## 2021-01-20 DIAGNOSIS — Z981 Arthrodesis status: Secondary | ICD-10-CM

## 2021-01-20 DIAGNOSIS — G8929 Other chronic pain: Secondary | ICD-10-CM

## 2021-01-20 DIAGNOSIS — M5136 Other intervertebral disc degeneration, lumbar region: Secondary | ICD-10-CM

## 2021-01-20 DIAGNOSIS — Z8781 Personal history of (healed) traumatic fracture: Secondary | ICD-10-CM

## 2021-01-20 DIAGNOSIS — M5441 Lumbago with sciatica, right side: Secondary | ICD-10-CM

## 2021-01-20 DIAGNOSIS — M5030 Other cervical disc degeneration, unspecified cervical region: Secondary | ICD-10-CM

## 2021-01-20 DIAGNOSIS — M5010 Cervical disc disorder with radiculopathy, unspecified cervical region: Secondary | ICD-10-CM

## 2021-01-20 IMAGING — MR MR CERVICAL SPINE W/O CM
5 series · 34 of 48 positions shown · non-contrast
Comparison: None available.

CLINICAL DATA: Initial evaluation for history of degenerative disc
disease, disc bulging. Prior traumatic back injury.

EXAM:
MRI CERVICAL SPINE WITHOUT CONTRAST
TECHNIQUE: Multiplanar, multisequence MR imaging of the cervical spine was
performed. No intravenous contrast was administered.

[Series 2: T2 · sagittal · 3.0mm · 0.41mm/px · 8 of 15 slices shown (1 of 2)]
[im 1/15]
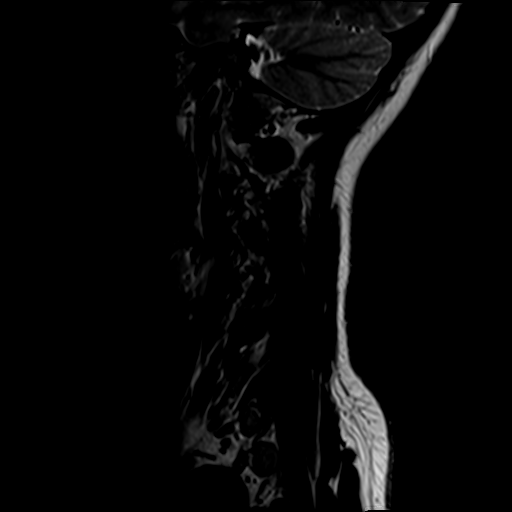
[im 3/15]
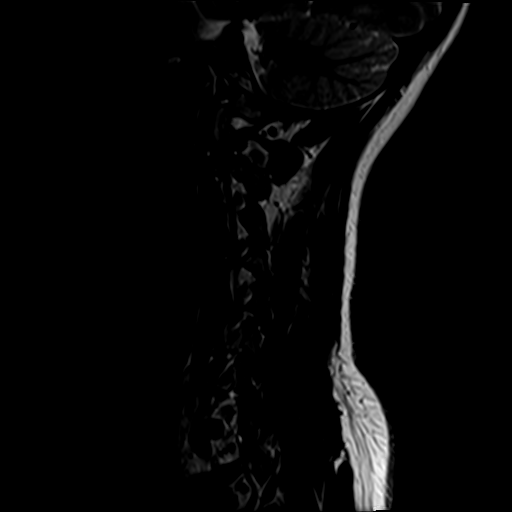
[im 5/15]
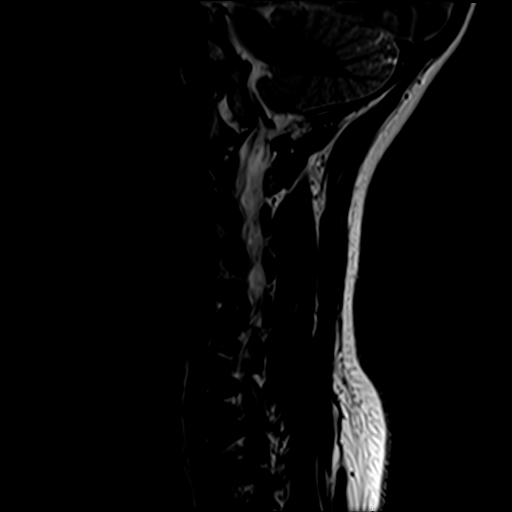
[im 7/15]
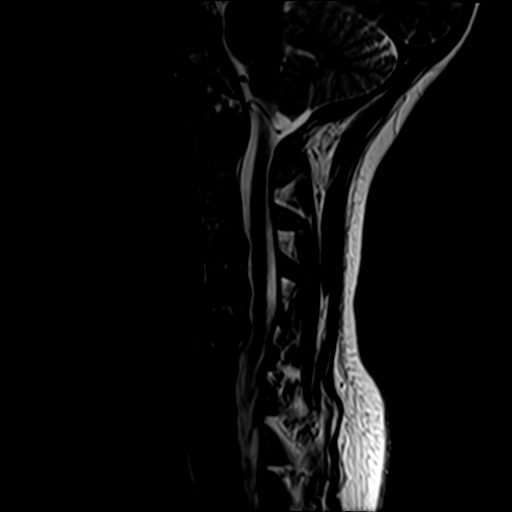
[im 9/15]
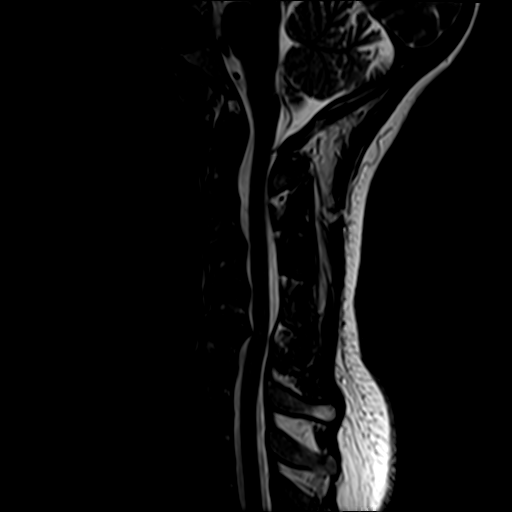
[im 11/15]
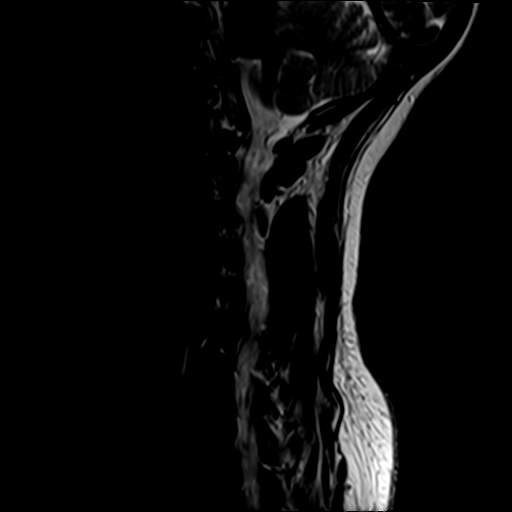
[im 13/15]
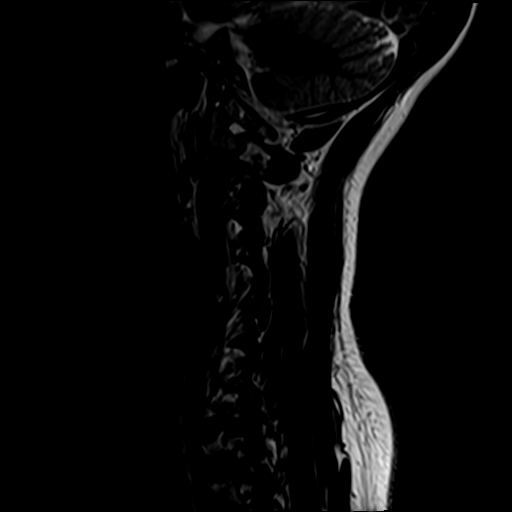
[im 15/15]
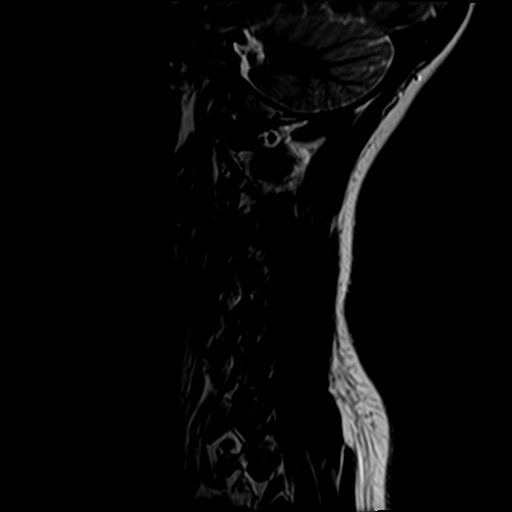

[Series 3: STIR · sagittal · 3.0mm · 0.82mm/px · 7 of 15 slices shown]
[im 1/15]
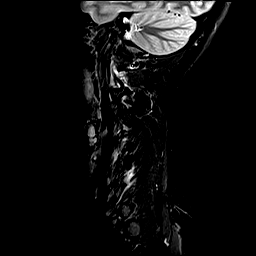
[im 3/15]
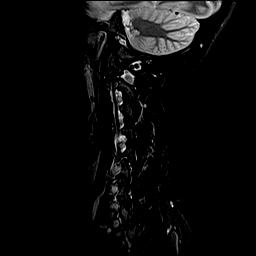
[im 5/15]
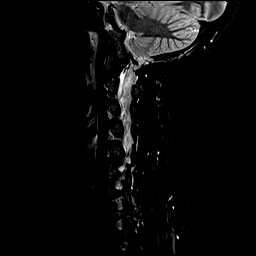
[im 8/15]
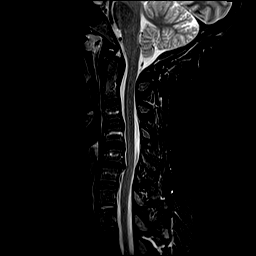
[im 10/15]
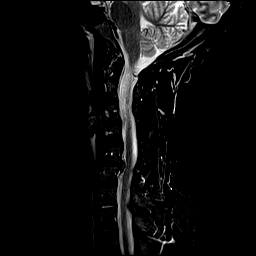
[im 12/15]
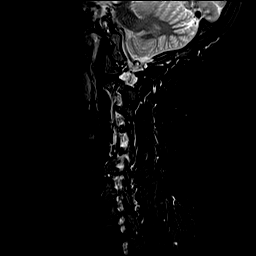
[im 15/15]
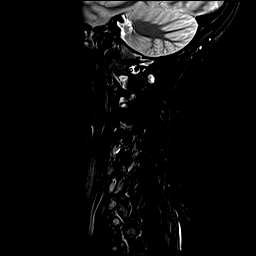

[Series 4: T1 · sagittal · 3.0mm · 0.82mm/px · 7 of 15 slices shown]
[im 1/15]
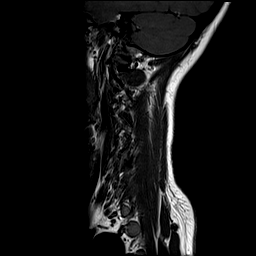
[im 3/15]
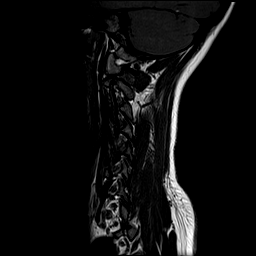
[im 5/15]
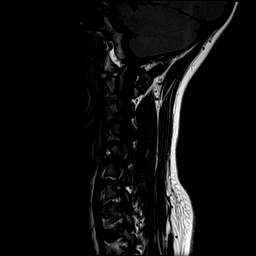
[im 8/15]
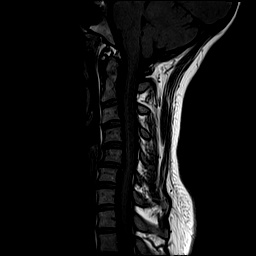
[im 10/15]
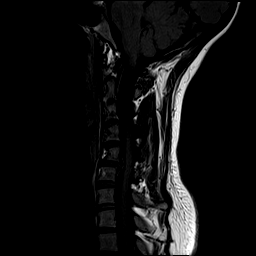
[im 12/15]
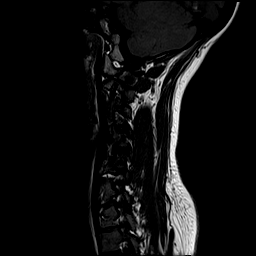
[im 15/15]
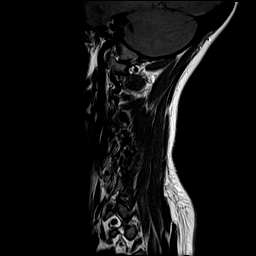

[Series 5: T2 · axial · 3.0mm · 0.70mm/px · z∈[-157,-60]mm · 9 of 27 slices shown (2 of 2)]
[im 1/27]
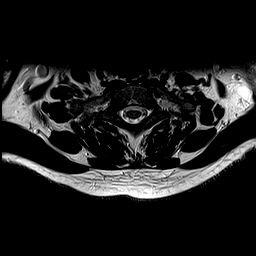
[im 5/27]
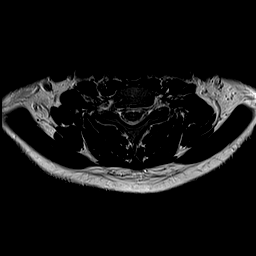
[im 9/27]
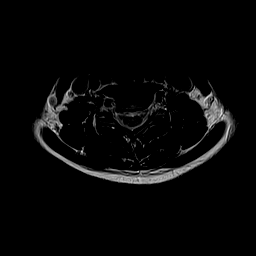
[im 11/27]
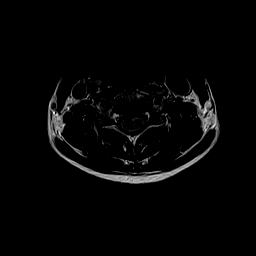
[im 14/27]
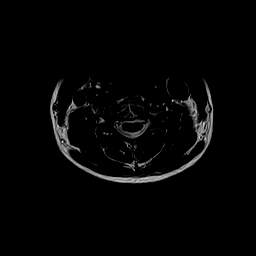
[im 16/27]
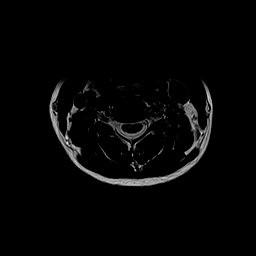
[im 18/27]
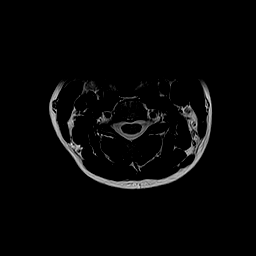
[im 22/27]
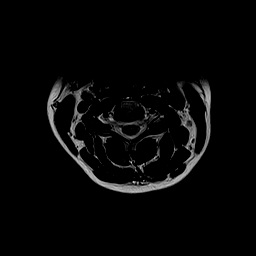
[im 27/27]
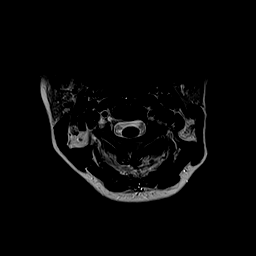

[Series 6: GRE · axial · 3.0mm · 0.35mm/px · z∈[-157,-128]mm · 3 of 27 slices shown]
[im 1/27]
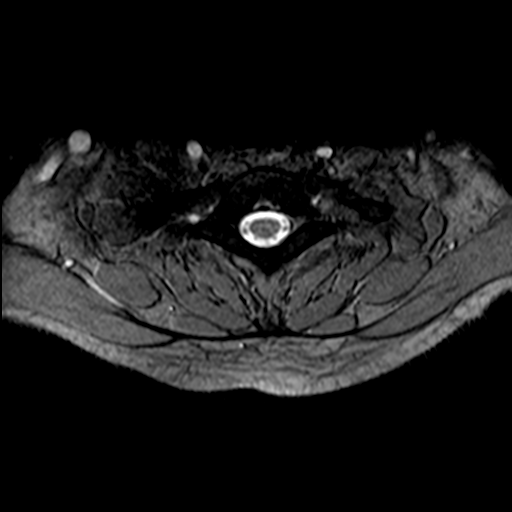
[im 5/27]
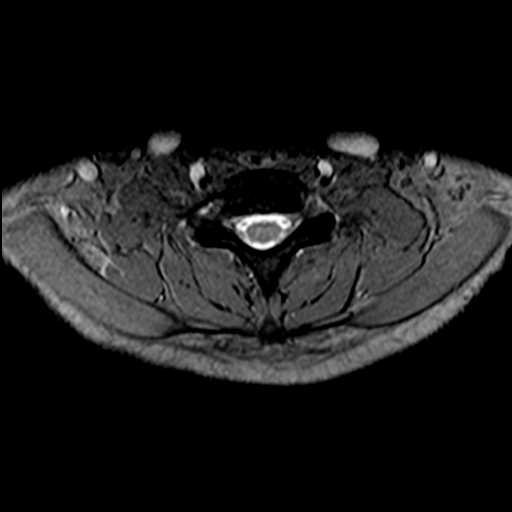
[im 9/27]
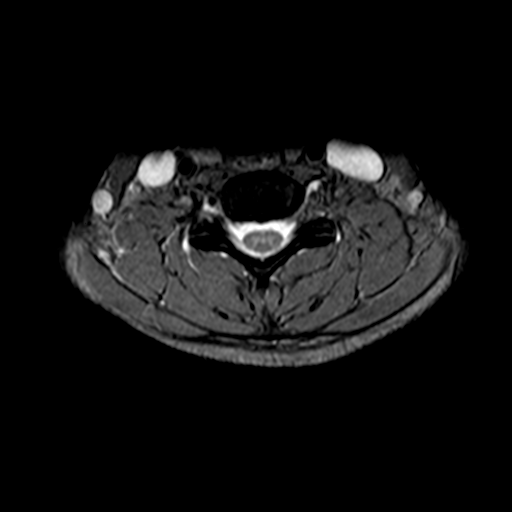

[34 of 48 positions shown; findings below may reference images not displayed]

FINDINGS: Alignment: Straightening with reversal of the normal cervical
lordosis, apex at C5-6. Trace anterolisthesis of C3 on C4 and C4 on
C5, with trace retrolisthesis of C5 on C6. Findings likely chronic
and degenerative.

Vertebrae: Vertebral body height maintained without acute or chronic
fracture. Bone marrow signal intensity within normal limits. No
discrete or worrisome osseous lesions. No abnormal marrow edema.

Cord: Normal signal and morphology.

Posterior Fossa, vertebral arteries, paraspinal tissues: Visualized
brain and posterior fossa within normal limits. Visualized adenoidal
soft tissues somewhat nodular in appearance. Craniocervical junction
normal. Paraspinous and prevertebral soft tissues within normal
limits. Normal flow voids seen within the vertebral arteries
bilaterally.

Disc levels:

C2-C3: Unremarkable.

C3-C4: Mild annular disc bulge. Secondary flattening of the ventral
thecal sac without significant spinal stenosis or cord impingement.
Foramina remain patent.

C4-C5: Mild annular disc bulge with superimposed tiny central disc
protrusion. Mild flattening of the ventral thecal sac with minimal
flattening of the ventral cord. No cord signal changes or
significant spinal stenosis. Foramina remain patent.

C5-C6: Degenerative intervertebral disc space narrowing with diffuse
disc osteophyte complex. Broad posterior component flattens and
partially faces the ventral thecal sac with resultant mild spinal
stenosis. Mild flattening of the ventral cord without cord signal
changes. Mild left greater than right C6 foraminal narrowing.

C6-C7: Mild disc bulge with superimposed tiny central disc
protrusion. No spinal stenosis. Foramina remain patent.

C7-T1:  Unremarkable.
IMPRESSION: 1. Degenerative disc osteophyte complex at C5-6 with resultant mild
spinal stenosis, with mild left greater than right C6 foraminal
narrowing.
2. Additional mild noncompressive disc bulging at C3-4, C4-5, and
C6-7 without significant stenosis or neural impingement.

## 2021-01-20 IMAGING — MR MR LUMBAR SPINE W/O CM
4 of 5 series · 26 of 48 positions shown · non-contrast
Comparison: None available.

CLINICAL DATA: Initial evaluation for low back pain. History of
prior compression fracture.

EXAM:
MRI LUMBAR SPINE WITHOUT CONTRAST
TECHNIQUE: Multiplanar, multisequence MR imaging of the lumbar spine was
performed. No intravenous contrast was administered.

[Series 2: T2 · sagittal · 4.0mm · 1.09mm/px · 6 of 15 slices shown (1 of 2)]
[im 1/15]
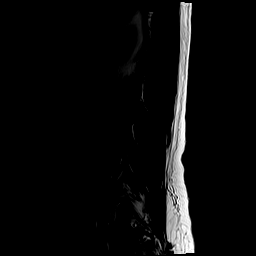
[im 3/15]
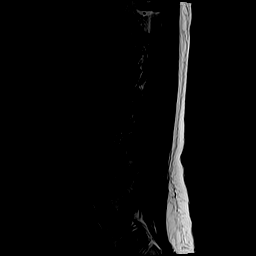
[im 6/15]
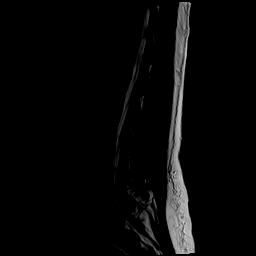
[im 9/15]
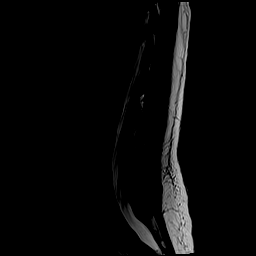
[im 12/15]
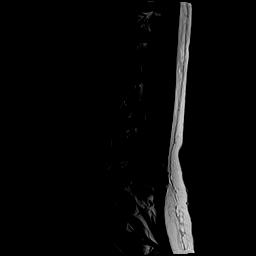
[im 15/15]
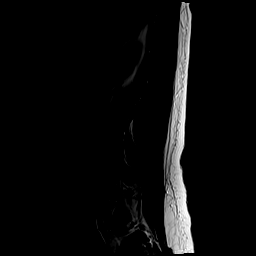

[Series 4: T1 · sagittal · 4.0mm · 1.09mm/px · 5 of 15 slices shown (1 of 2)]
[im 1/15]
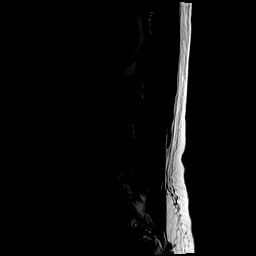
[im 4/15]
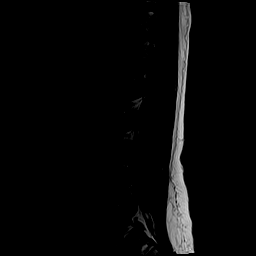
[im 8/15]
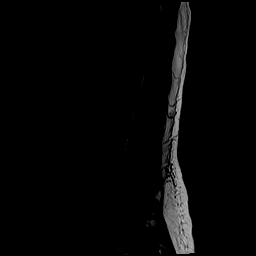
[im 11/15]
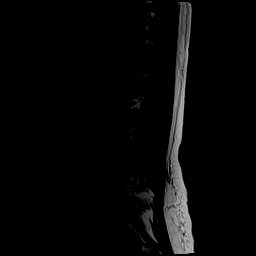
[im 15/15]
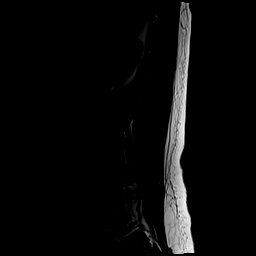

[Series 5: T2 · axial · 4.0mm · 0.39mm/px · z∈[-602,-385]mm · 10 of 46 slices shown (2 of 2)]
[im 4/46]
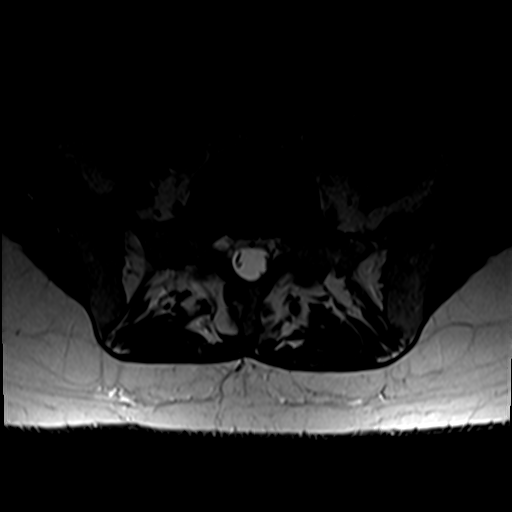
[im 7/46]
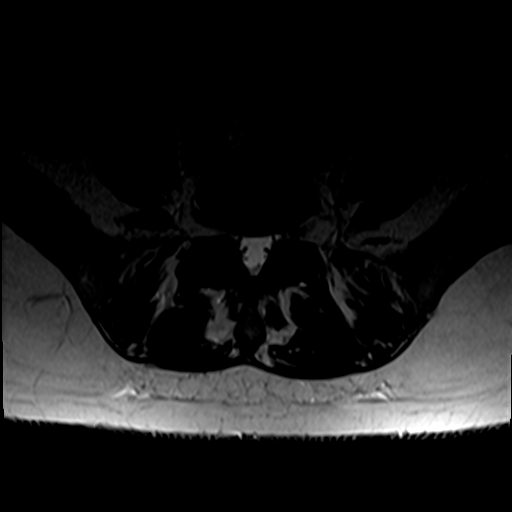
[im 10/46]
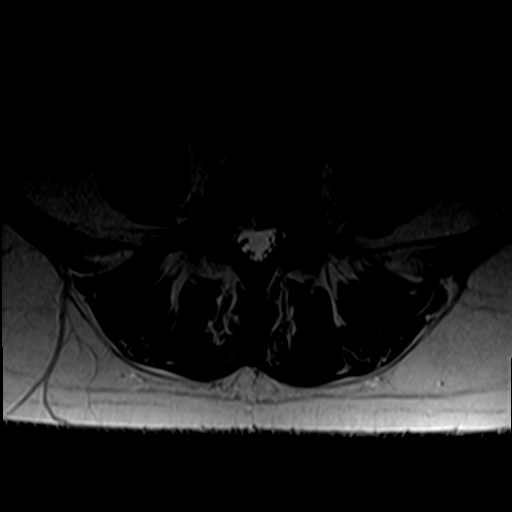
[im 16/46]
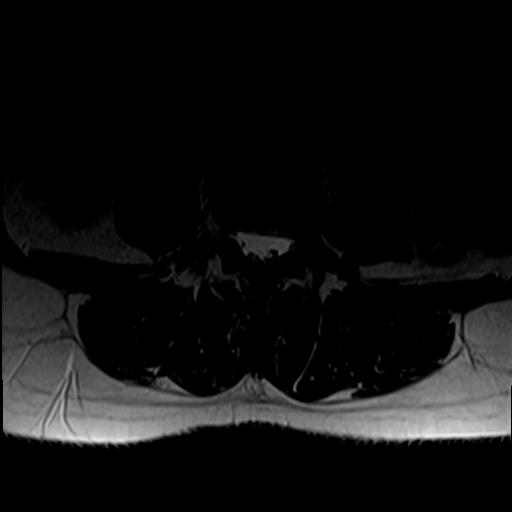
[im 22/46]
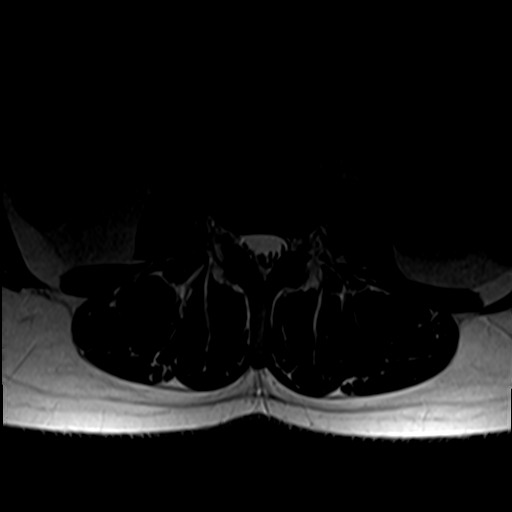
[im 25/46]
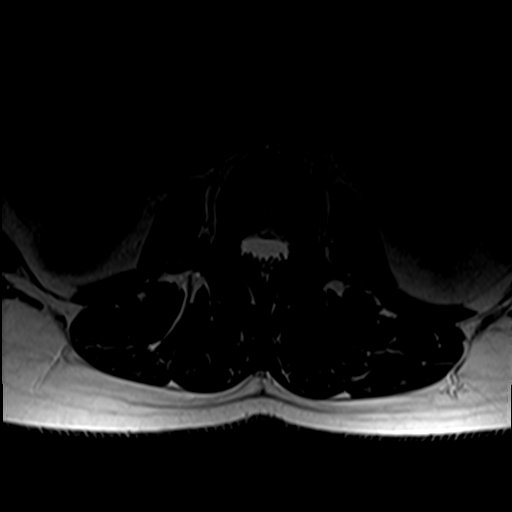
[im 28/46]
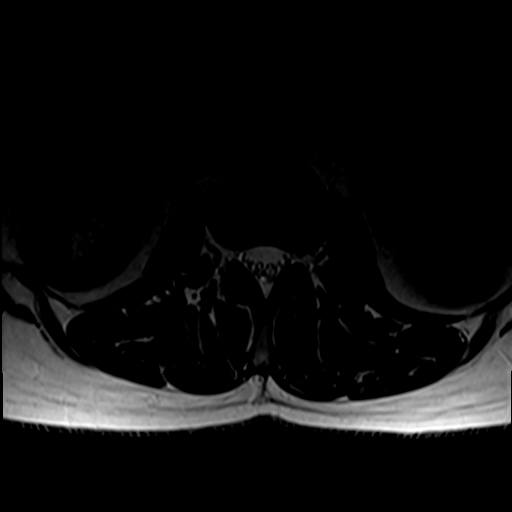
[im 34/46]
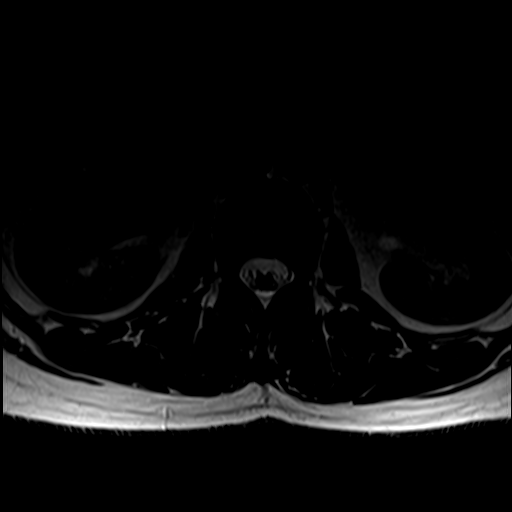
[im 40/46]
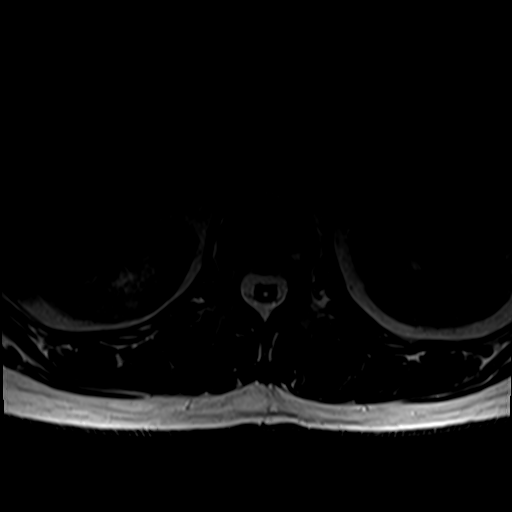
[im 46/46]
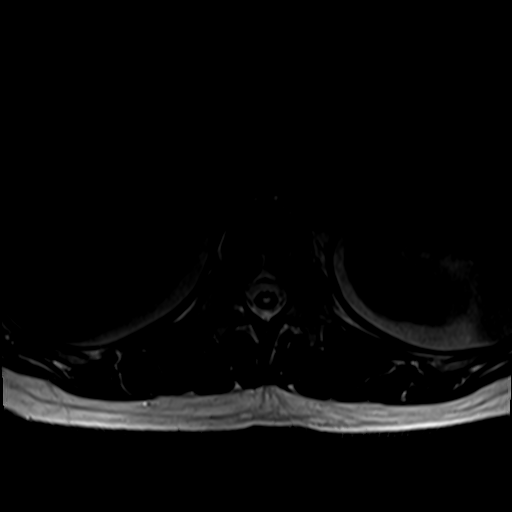

[Series 6: T1 · axial · 4.0mm · 0.39mm/px · z∈[-602,-413]mm · 5 of 46 slices shown (2 of 2)]
[im 4/46]
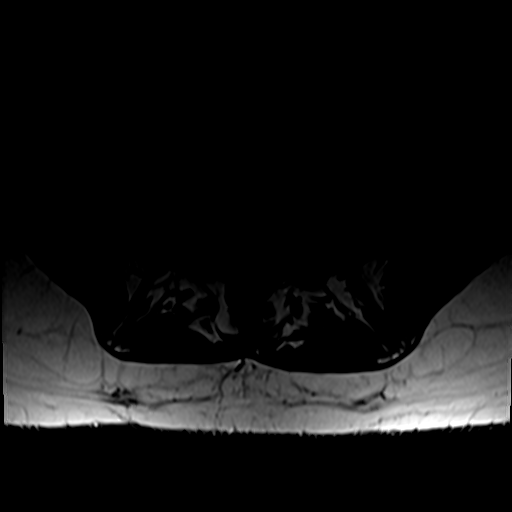
[im 7/46]
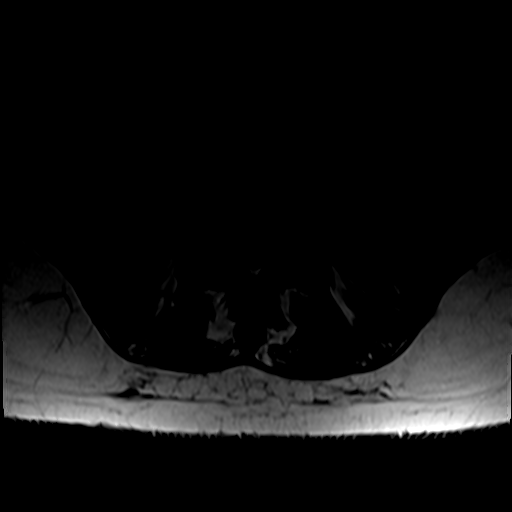
[im 10/46]
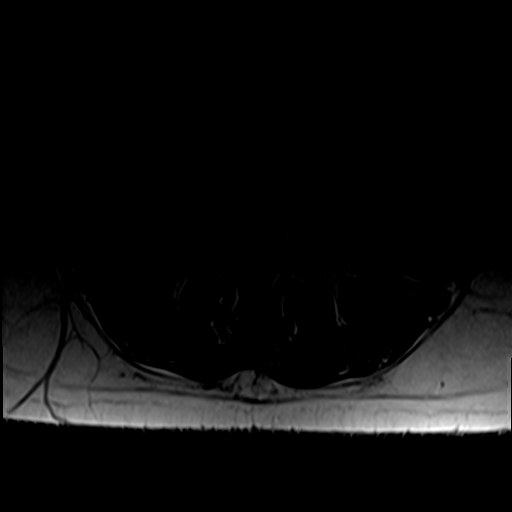
[im 25/46]
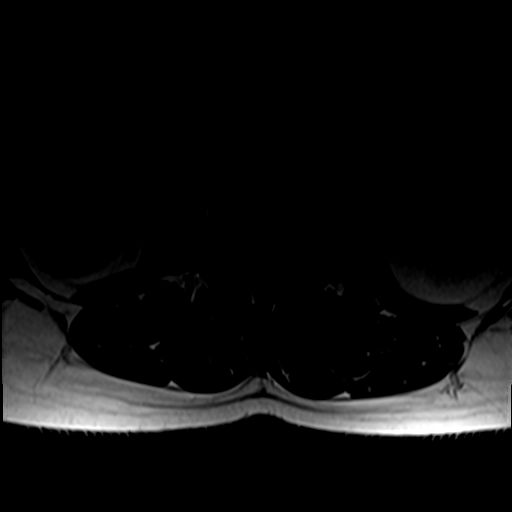
[im 40/46]
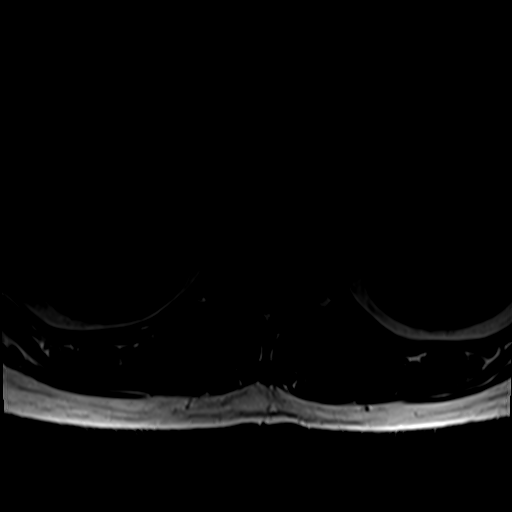

[26 of 48 positions shown; findings below may reference images not displayed]

FINDINGS: Segmentation: Standard. Lowest well-formed disc space labeled the
L5-S1 level.

Alignment: Physiologic with preservation of the normal lumbar
lordosis. No listhesis.

Vertebrae: Chronic compression fracture with sequelae of prior
vertebral augmentation present at the L1 vertebral body. Height loss
at this level is relatively mild measuring no more than 10% without
bony retropulsion. Otherwise, vertebral body height well maintained.
Bone marrow signal intensity within normal limits. Few scattered
benign hemangiomata noted about the sacrum. No worrisome osseous
lesions. No abnormal marrow edema.

Conus medullaris and cauda equina: Conus extends to the L2 level.
Syrinx involving the lower thoracic cord partially visualize, better
evaluated on thoracic spine MRI. Conus medullaris and nerve roots of
the cauda equina otherwise unremarkable.

Paraspinal and other soft tissues: Unremarkable.

Disc levels:

L1-2: Minimal biforaminal disc bulging. No spinal stenosis. Foramina
remain patent. No impingement.

L2-3:  Unremarkable.

L3-4:  Unremarkable.

L4-5: Negative interspace. The right L4 and descending L5 nerve
roots appear partially conjoined, dividing at the level of the right
neural foramen (series 6, image 34). Minimal facet spurring. No
spinal stenosis. Foramina remain patent.

L5-S1: Disc desiccation. Superimposed small central disc protrusion
with annular fissure mildly indents the ventral thecal sac (series
5, image 41). This is slightly eccentric to the left. Minimal facet
spurring. Resultant mild narrowing of the lateral recesses
bilaterally. Central canal remains widely patent. No foraminal
stenosis.
IMPRESSION: 1. Chronic compression fracture with sequelae of prior vertebral
augmentation at L1. Associated mild 10% height loss without bony
retropulsion or stenosis.
2. Small central disc protrusion with annular fissure at L5-S1 with
resultant mild bilateral lateral recess stenosis.

## 2021-01-20 IMAGING — MR MR THORACIC SPINE W/O CM
4 of 6 series · 20 of 48 positions shown · non-contrast
Comparison: None available.

CLINICAL DATA: Initial evaluation for mid back pain, intervertebral
disc disorder, history of prior compression fracture.

EXAM:
MRI THORACIC SPINE WITHOUT CONTRAST
TECHNIQUE: Multiplanar, multisequence MR imaging of the thoracic spine was
performed. No intravenous contrast was administered.

[Series 3: T1 · sagittal · 3.0mm · 1.41mm/px · 3 of 15 slices shown]
[im 3/15]
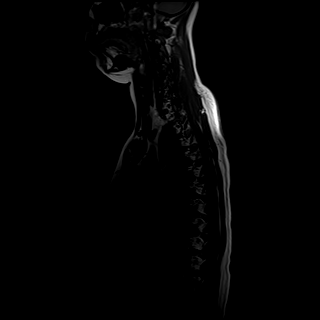
[im 9/15]
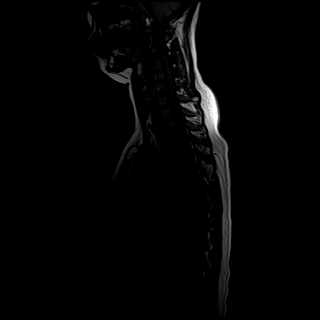
[im 15/15]
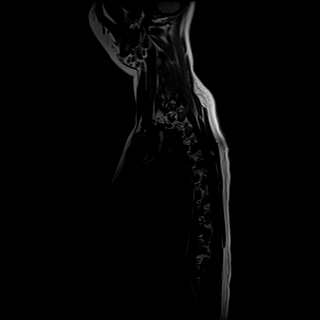

[Series 4: T2 · sagittal · 4.0mm · 0.55mm/px · 6 of 16 slices shown (1 of 3)]
[im 1/16]
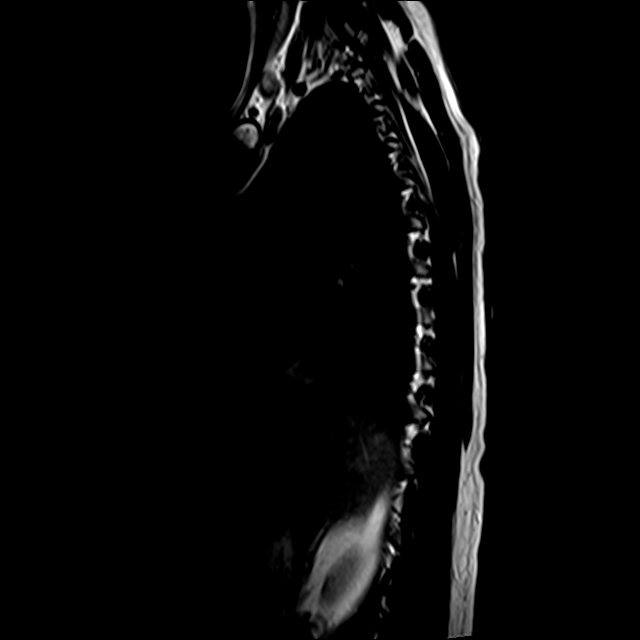
[im 4/16]
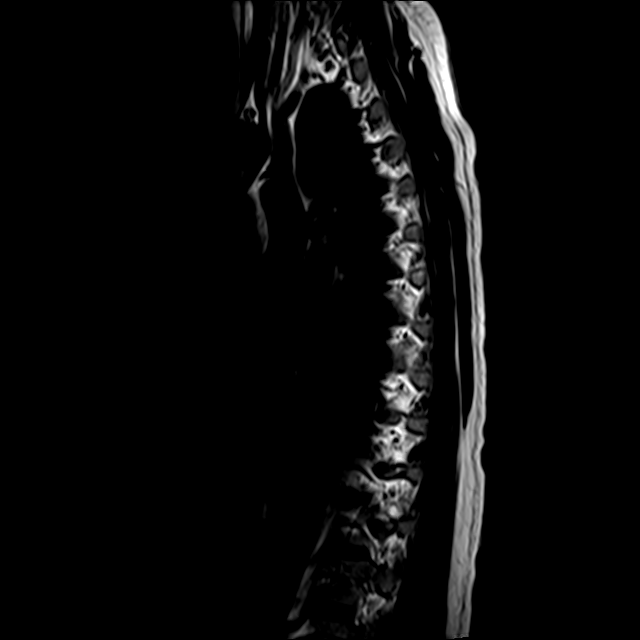
[im 7/16]
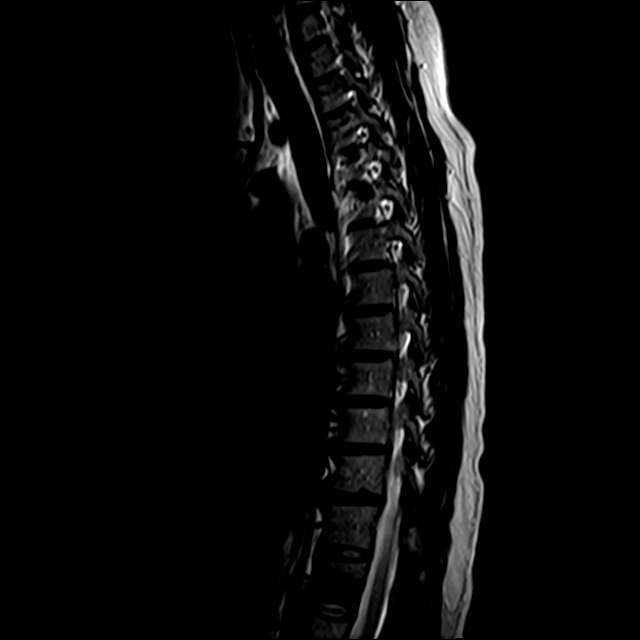
[im 10/16]
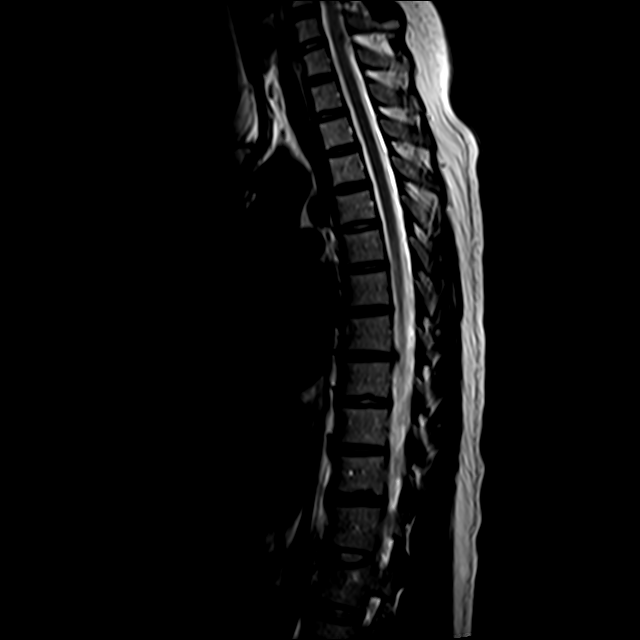
[im 13/16]
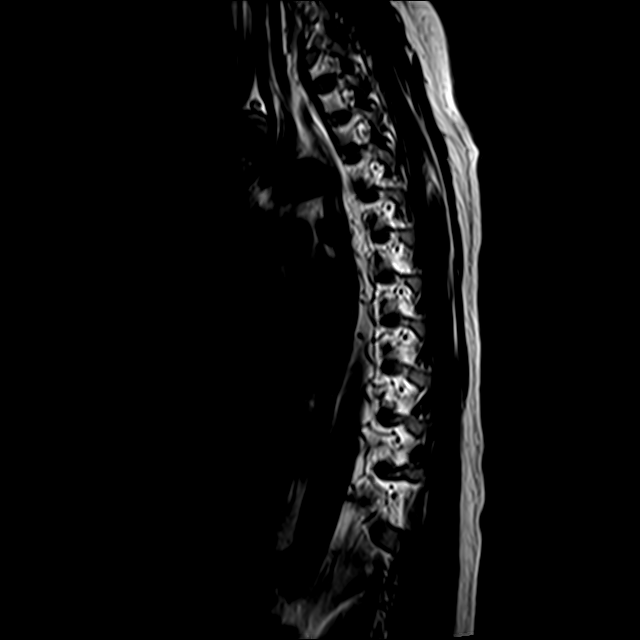
[im 16/16]
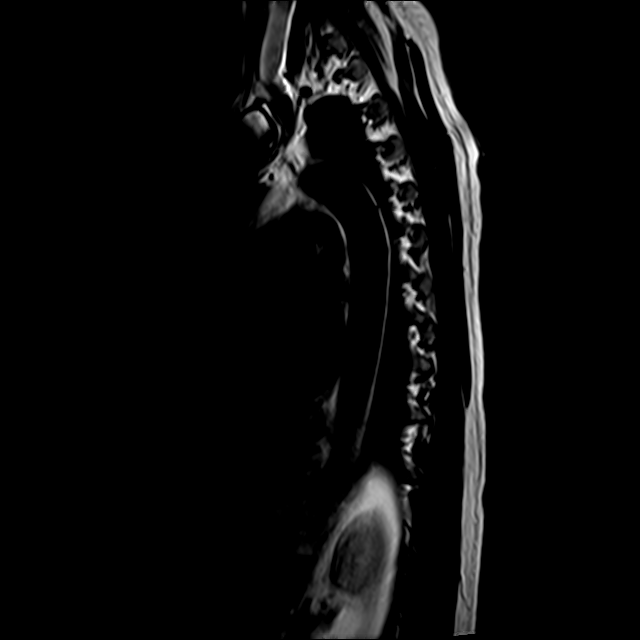

[Series 7: T2 · axial · 4.0mm · 0.39mm/px · z∈[-411,-183]mm · 8 of 40 slices shown (2 of 3)]
[im 1/40]
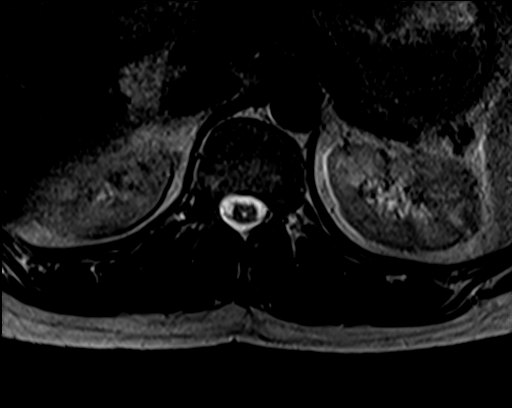
[im 7/40]
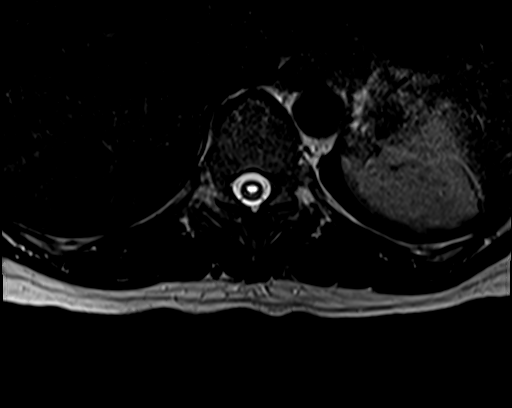
[im 14/40]
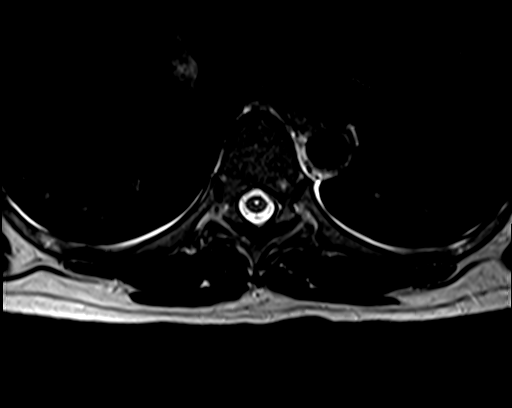
[im 17/40]
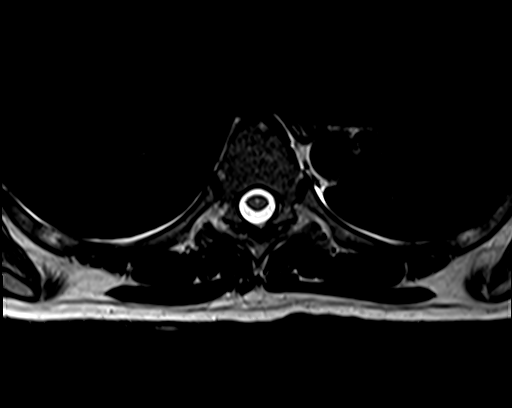
[im 20/40]
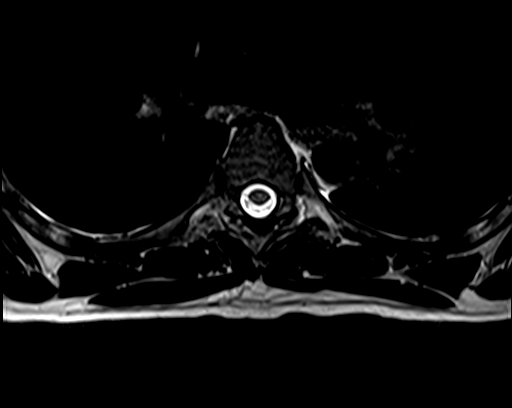
[im 23/40]
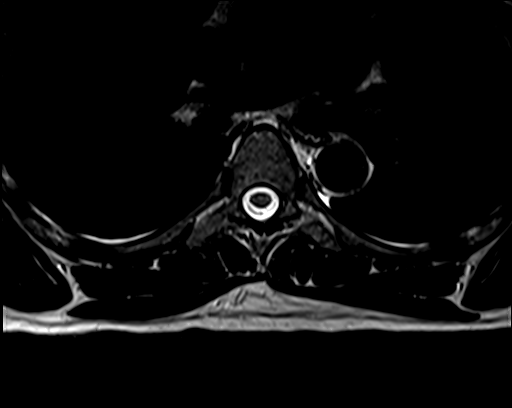
[im 27/40]
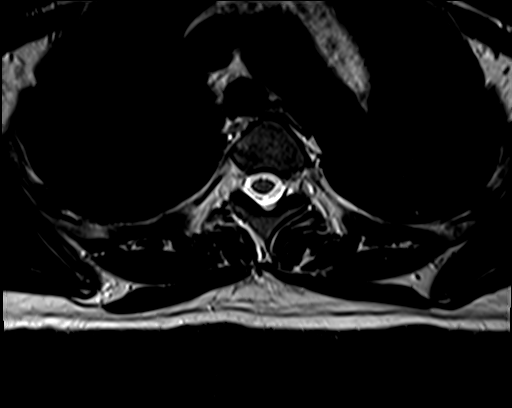
[im 33/40]
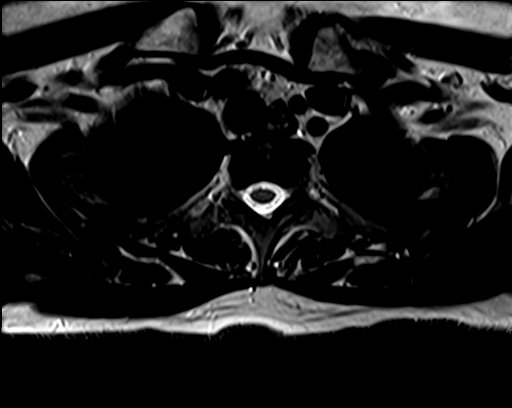

[Series 8: T2 · axial · 4.0mm · 0.39mm/px · z∈[-364,-183]mm · 3 of 40 slices shown (3 of 3)]
[im 7/40]
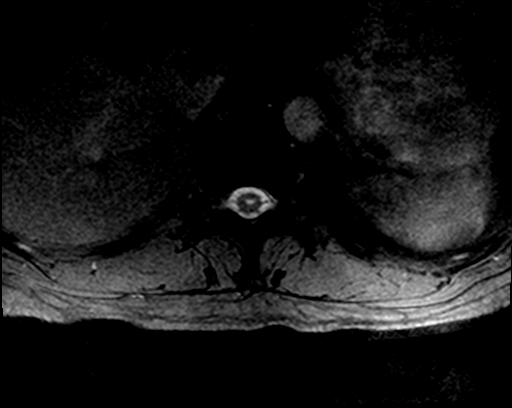
[im 20/40]
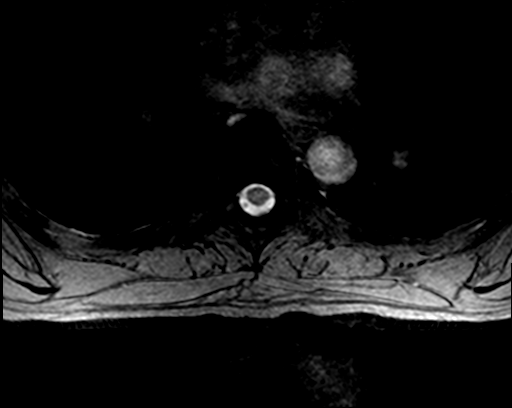
[im 33/40]
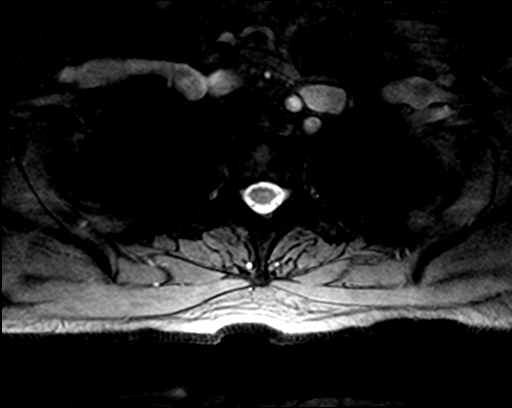

[20 of 48 positions shown; findings below may reference images not displayed]

FINDINGS: Alignment: Vertebral bodies normally aligned with preservation of
the normal thoracic kyphosis. No listhesis.

Vertebrae: Chronic compression deformity with sequelae of prior
vertebral augmentation noted at the L1 vertebral body. This is
better evaluated on corresponding MRI of the lumbar spine. Vertebral
body height otherwise maintained with no other acute or chronic
fracture. Bone marrow signal intensity within normal limits. No
discrete or worrisome osseous lesions. No abnormal marrow edema.

Cord: Syrinx involving the lower thoracic spine is seen. This
extends from approximately the level of T8 through L1, measuring up
4 mm in maximal transverse diameter at the level of T10-11 (series
7, image 33). Otherwise, signal intensity within the thoracic spinal
cord is normal.

Paraspinal and other soft tissues: Probable trace layering bilateral
pleural effusions noted. Paraspinous soft tissues otherwise
unremarkable.

Disc levels:

T8-9: Small left subarticular to foraminal disc protrusion mildly
indents the left ventral thecal sac (series 4, image 10). No
significant spinal stenosis or significant cord deformity. Foramina
remain patent.

T9-10: Additional tiny left subarticular disc protrusion minimally
indents the left ventral thecal sac (series 7, image 30). No
significant stenosis or cord deformity.

Otherwise, no other significant disc pathology seen within the
thoracic spine. No significant facet disease. No spinal stenosis.
Foramina remain patent. No impingement.
IMPRESSION: 1. Small left subarticular to foraminal disc protrusions at T8-9 and
T9-10 without significant stenosis or cord impingement.
2. Syrinx involving the lower thoracic spinal cord measuring up to 4
mm in diameter, extending from T8 through L1.
3. Otherwise unremarkable MRI of the thoracic spine.

## 2021-01-25 ENCOUNTER — Encounter (HOSPITAL_BASED_OUTPATIENT_CLINIC_OR_DEPARTMENT_OTHER): Payer: Self-pay | Admitting: Nurse Practitioner

## 2021-01-25 ENCOUNTER — Other Ambulatory Visit (HOSPITAL_BASED_OUTPATIENT_CLINIC_OR_DEPARTMENT_OTHER): Payer: Self-pay | Admitting: Nurse Practitioner

## 2021-01-25 DIAGNOSIS — G950 Syringomyelia and syringobulbia: Secondary | ICD-10-CM

## 2021-04-06 ENCOUNTER — Ambulatory Visit (HOSPITAL_BASED_OUTPATIENT_CLINIC_OR_DEPARTMENT_OTHER): Payer: 59 | Admitting: Nurse Practitioner

## 2021-05-23 ENCOUNTER — Encounter (HOSPITAL_BASED_OUTPATIENT_CLINIC_OR_DEPARTMENT_OTHER): Payer: Self-pay | Admitting: Nurse Practitioner

## 2021-05-23 ENCOUNTER — Ambulatory Visit (INDEPENDENT_AMBULATORY_CARE_PROVIDER_SITE_OTHER): Payer: 59 | Admitting: Nurse Practitioner

## 2021-05-23 ENCOUNTER — Other Ambulatory Visit: Payer: Self-pay

## 2021-05-23 VITALS — BP 117/72 | HR 104 | Ht 66.0 in | Wt 151.0 lb

## 2021-05-23 DIAGNOSIS — J0140 Acute pansinusitis, unspecified: Secondary | ICD-10-CM | POA: Insufficient documentation

## 2021-05-23 MED ORDER — AMOXICILLIN-POT CLAVULANATE 875-125 MG PO TABS: 1.0000 | ORAL_TABLET | Freq: Two times a day (BID) | ORAL | 0 refills | Status: DC

## 2021-05-23 MED ORDER — METHYLPREDNISOLONE 4 MG PO TBPK: ORAL_TABLET | ORAL | 0 refills | Status: DC

## 2021-05-23 NOTE — Assessment & Plan Note (Signed)
Symptoms and presentation consistent with acute sinusitis. Given her history of chronic sinusitis, will send treatment today with augmentin and medrol dose pack for management.  Encourage increased hydration, rest, and follow-up if symptoms worsen or fail to improve.  Symptoms not consistent with covid, flu, or strep at this time therefore testing deferred.

## 2021-05-23 NOTE — Progress Notes (Signed)
Acute Office Visit  Subjective:    Patient ID: Tammie Frederick, female    DOB: 20-Nov-1980, 41 y.o.   MRN: [id number]  Chief Complaint  Patient presents with   Acute Visit    Patient presents today with green drainage x 3days. Patient stated her face hurts. No fever.     HPI Patient is in today for sinus pain/pressure on the left < right side, ear pain, PND, rhinorrhea, and cough. Her symptoms first started about 4 days ago. She denies fevers or chills. She has no known sick contacts. She reports mild sore throat from PND. She does have a history of chronic sinusitis with severe symptoms requiring antibiotic and medrol dose pack for relief. She has no nausea, vomiting, diarrhea or shortness of breath.   Past Medical History:  Diagnosis Date   AMA (advanced maternal age) multigravida 35+ 06/30/2019   Anxiety    Asthma    occasional inhaler use   Back injury    Broken finger    H/O sinus surgery    History of back surgery    HPV (human papilloma virus) anogenital infection    IUGR (intrauterine growth restriction) affecting care of mother 04/15/2019   Preterm labor    PTL starting @ 28 wks   Short cervical length during pregnancy 04/14/2019   Threatened preterm labor 04/15/2019   Vaginal Pap smear, abnormal     Past Surgical History:  Procedure Laterality Date   BACK SURGERY     CERVICAL BIOPSY  W/ LOOP ELECTRODE EXCISION     FRACTURE SURGERY     fixation of fracture using dynamic compression system   NASAL SINUS SURGERY     multiple    Family History  Problem Relation Age of Onset   Hyperlipidemia Mother    Depression Father    Anxiety disorder Father    Breast cancer Maternal Grandmother    Lung cancer Maternal Grandmother    Diabetes Paternal Grandfather     Social History   Socioeconomic History   Marital status: Single    Spouse name: Not on file   Number of children: Not on file   Years of education: Not on file   Highest education level: Not on file   Occupational History   Occupation: human resources    Comment: [employer]  Tobacco Use   Smoking status: Never   Smokeless tobacco: Never  Vaping Use   Vaping Use: Never used  Substance and Sexual Activity   Alcohol use: Not Currently    Comment: socially   Drug use: Never   Sexual activity: Yes    Birth control/protection: I.U.D.  Other Topics Concern   Not on file  Social History Narrative   Not on file   Social Determinants of Health   Financial Resource Strain: Not on file  Food Insecurity: Not on file  Transportation Needs: Not on file  Physical Activity: Not on file  Stress: Not on file  Social Connections: Not on file  Intimate Partner Violence: Not on file    Outpatient Medications Prior to Visit  Medication Sig Dispense Refill   albuterol (VENTOLIN HFA) 108 (90 Base) MCG/ACT inhaler Inhale 2 puffs into the lungs every 4 (four) hours as needed for wheezing or shortness of breath.     celecoxib (CELEBREX) 100 MG capsule Take 1 capsule (100 mg total) by mouth 2 (two) times daily. 60 capsule 6   loratadine (CLARITIN) 10 MG tablet Take by mouth.     methocarbamol (  ROBAXIN-750) 750 MG tablet Take 1 tablet (750 mg total) by mouth every 6 (six) hours as needed for muscle spasms. 120 tablet 3   paragard intrauterine copper IUD IUD 1 each by Intrauterine route once.     No facility-administered medications prior to visit.    Allergies  Allergen Reactions   Sulfa Antibiotics Nausea And Vomiting and Other (See Comments)    Severe Stomach pain- Sulfa Abx only    Cetirizine Rash   Latex Swelling   Cauliflower [Brassica Oleracea] Itching    Itching of the mouth and throat   Azithromycin Other (See Comments)    Stomach pains    Hydromorphone Hcl Itching    Face itching     Review of Systems     Objective:    Physical Exam Vitals and nursing note reviewed.  Constitutional:      Appearance: She is ill-appearing.  HENT:     Head: Normocephalic.     Right Ear: A  middle ear effusion is present. Tympanic membrane is bulging.     Left Ear: A middle ear effusion is present. Tympanic membrane is retracted.     Nose: Nasal tenderness, mucosal edema, congestion and rhinorrhea present. Rhinorrhea is purulent.     Right Turbinates: Enlarged.     Left Turbinates: Enlarged.     Right Sinus: Maxillary sinus tenderness and frontal sinus tenderness present.     Left Sinus: Maxillary sinus tenderness and frontal sinus tenderness present.     Mouth/Throat:     Mouth: Mucous membranes are moist.     Pharynx: Posterior oropharyngeal erythema present. No pharyngeal swelling, oropharyngeal exudate or uvula swelling.     Tonsils: No tonsillar exudate.  Cardiovascular:     Rate and Rhythm: Normal rate and regular rhythm.     Pulses: Normal pulses.     Heart sounds: Normal heart sounds.  Pulmonary:     Effort: Pulmonary effort is normal.     Breath sounds: Normal breath sounds.  Musculoskeletal:     Right lower leg: No edema.     Left lower leg: No edema.  Lymphadenopathy:     Cervical: Cervical adenopathy present.  Skin:    General: Skin is warm and dry.     Capillary Refill: Capillary refill takes less than 2 seconds.  Neurological:     General: No focal deficit present.     Mental Status: She is alert and oriented to person, place, and time.  Psychiatric:        Mood and Affect: Mood normal.        Behavior: Behavior normal.        Thought Content: Thought content normal.        Judgment: Judgment normal.    BP 117/72    Pulse (!) 104    Ht 5\' 6"  (1.676 m)    Wt 151 lb (68.5 kg)    SpO2 96%    BMI 24.37 kg/m  Wt Readings from Last 3 Encounters:  05/23/21 151 lb (68.5 kg)  12/27/20 157 lb 12.8 oz (71.6 kg)  11/08/20 160 lb (72.6 kg)       Assessment & Plan:   Problem List Items Addressed This Visit     Acute pansinusitis - Primary    Symptoms and presentation consistent with acute sinusitis. Given her history of chronic sinusitis, will send  treatment today with augmentin and medrol dose pack for management.  Encourage increased hydration, rest, and follow-up if symptoms worsen or fail to  improve.  Symptoms not consistent with covid, flu, or strep at this time therefore testing deferred.       Relevant Medications   amoxicillin-clavulanate (AUGMENTIN) 875-125 MG tablet   methylPREDNISolone (MEDROL DOSEPAK) 4 MG TBPK tablet     Meds ordered this encounter  Medications   amoxicillin-clavulanate (AUGMENTIN) 875-125 MG tablet    Sig: Take 1 tablet by mouth 2 (two) times daily.    Dispense:  10 tablet    Refill:  0   methylPREDNISolone (MEDROL DOSEPAK) 4 MG TBPK tablet    Sig: Taper as directed on packaging    Dispense:  21 tablet    Refill:  0     Tollie Eth, NP

## 2021-08-25 ENCOUNTER — Encounter (HOSPITAL_BASED_OUTPATIENT_CLINIC_OR_DEPARTMENT_OTHER): Payer: Self-pay | Admitting: Nurse Practitioner

## 2021-12-05 ENCOUNTER — Ambulatory Visit (HOSPITAL_BASED_OUTPATIENT_CLINIC_OR_DEPARTMENT_OTHER): Payer: 59 | Admitting: Nurse Practitioner

## 2021-12-05 ENCOUNTER — Other Ambulatory Visit (HOSPITAL_BASED_OUTPATIENT_CLINIC_OR_DEPARTMENT_OTHER): Payer: Self-pay

## 2021-12-05 ENCOUNTER — Encounter (HOSPITAL_BASED_OUTPATIENT_CLINIC_OR_DEPARTMENT_OTHER): Payer: Self-pay | Admitting: Nurse Practitioner

## 2021-12-05 VITALS — BP 114/73 | HR 73 | Ht 67.0 in | Wt 156.8 lb

## 2021-12-05 DIAGNOSIS — F902 Attention-deficit hyperactivity disorder, combined type: Secondary | ICD-10-CM | POA: Diagnosis not present

## 2021-12-05 DIAGNOSIS — F3281 Premenstrual dysphoric disorder: Secondary | ICD-10-CM | POA: Diagnosis not present

## 2021-12-05 MED ORDER — AMPHETAMINE-DEXTROAMPHETAMINE 10 MG PO TABS
10.0000 mg | ORAL_TABLET | Freq: Two times a day (BID) | ORAL | 0 refills | Status: DC
  Filled 2021-12-05: qty 60, 30d supply, fill #0

## 2021-12-05 NOTE — Progress Notes (Signed)
Tammie Clamp, DNP, AGNP-c [facility] [address] [place], [place] [address] [phone] Office [phone] Fax  ESTABLISHED PATIENT- Chronic Health and/or Follow-Up Visit  Blood pressure 114/73, pulse 73, height 5\' 7"  (1.702 m), weight 156 lb 12.8 oz (71.1 kg), SpO2 99 %, unknown if currently breastfeeding.    Tammie Frederick is a 41 y.o. year old female presenting today for evaluation and management of the following: Follow-up (Patient presents today every time she has her menstrual cycle she states she is off the deep end. Thinks she has ADHD)   Attention deficit hyperactivity disorder (ADHD), combined type Tammie Frederick endorses concerns with difficulty maintaining focus on difficult tasks, starting projects, finishing projects, and finding motivation.  She reports that the symptoms have been present for a long time however she has just thought of them as part of her personality.  She has recently been paying more attention to these behaviors and feels that she may have a component of attention deficit disorder.  Mood Changes Tammie Frederick endorses that the week before and the week of her menstrual cycle her emotions are "all over the place". She tells me if ranges from crying over slight things, to anger, to decreased motivation.  She reports that she has been able to control this in the past however it does seem to be getting worse.  She feels like the alteration in her mood is made worse by her difficulty with attention and focus.  She tells me that she feels like if she can get her focus improved that this may not be as severe.  All ROS negative with exception of what is listed above.   PHYSICAL EXAM Physical Exam Vitals and nursing note reviewed.  Constitutional:      Appearance: Normal appearance.  HENT:     Head: Normocephalic.  Eyes:     Extraocular Movements: Extraocular movements intact.     Pupils: Pupils are equal, round, and  reactive to light.  Cardiovascular:     Rate and Rhythm: Normal rate and regular rhythm.     Pulses: Normal pulses.     Heart sounds: Normal heart sounds.  Pulmonary:     Effort: Pulmonary effort is normal.     Breath sounds: Normal breath sounds.  Musculoskeletal:        General: Normal range of motion.  Skin:    General: Skin is warm and dry.     Capillary Refill: Capillary refill takes less than 2 seconds.  Neurological:     General: No focal deficit present.     Mental Status: She is alert.  Psychiatric:        Mood and Affect: Mood normal.        Behavior: Behavior normal.        Thought Content: Thought content normal.        Judgment: Judgment normal.     PLAN Problem List Items Addressed This Visit     Pre-menstrual mood disorder    Significant mood changes near menstrual cycle.  Suspect that there is a component of premenstrual mood disorder present.  At this time we are starting medication for attention deficit and do not want to start double medication at this time.  She is in agreement to hold off on medication for mood until ADHD symptoms are better.  It is possible that if her attention and focus are improved her mood will not be as severely affected.  We will follow-up in the next few  weeks to see how she is doing and determine next steps.      Attention deficit hyperactivity disorder (ADHD), combined type - Primary    Concerns presented with difficulty with attention and focus.  Adult ADHD questionnaire provided today with significantly positive results.  Age of onset around 41 years old.  Given the current symptoms and evaluation I do feel it is reasonable to consider treatment for attention deficit disorder to see if this is helpful to manage her symptoms.  We discussed medication management and joint decision made to trial Adderall 10 mg to see if this is helpful for symptoms.  We will hold off on starting anything for mood at this time to avoid possible  interaction.  She will follow-up with me in the next few weeks to let me know how this is working.      Relevant Medications   amphetamine-dextroamphetamine (ADDERALL) 10 MG tablet    Return for phone call in 3 weeks to see how meds are working.   Tammie Clamp, DNP, AGNP-c 12/05/2021  2:42 PM

## 2021-12-05 NOTE — Patient Instructions (Signed)
Let's start with trying ADHD medication to see if we can get an improvement in your mood and focus.

## 2021-12-24 DIAGNOSIS — F902 Attention-deficit hyperactivity disorder, combined type: Secondary | ICD-10-CM | POA: Insufficient documentation

## 2021-12-24 DIAGNOSIS — F3281 Premenstrual dysphoric disorder: Secondary | ICD-10-CM | POA: Insufficient documentation

## 2021-12-24 NOTE — Assessment & Plan Note (Signed)
Significant mood changes near menstrual cycle.  Suspect that there is a component of premenstrual mood disorder present.  At this time we are starting medication for attention deficit and do not want to start double medication at this time.  She is in agreement to hold off on medication for mood until ADHD symptoms are better.  It is possible that if her attention and focus are improved her mood will not be as severely affected.  We will follow-up in the next few weeks to see how she is doing and determine next steps.

## 2021-12-24 NOTE — Assessment & Plan Note (Signed)
Concerns presented with difficulty with attention and focus.  Adult ADHD questionnaire provided today with significantly positive results.  Age of onset around 41 years old.  Given the current symptoms and evaluation I do feel it is reasonable to consider treatment for attention deficit disorder to see if this is helpful to manage her symptoms.  We discussed medication management and joint decision made to trial Adderall 10 mg to see if this is helpful for symptoms.  We will hold off on starting anything for mood at this time to avoid possible interaction.  She will follow-up with me in the next few weeks to let me know how this is working.

## 2021-12-26 ENCOUNTER — Telehealth (HOSPITAL_BASED_OUTPATIENT_CLINIC_OR_DEPARTMENT_OTHER): Payer: 59 | Admitting: Nurse Practitioner

## 2022-02-07 ENCOUNTER — Other Ambulatory Visit (HOSPITAL_BASED_OUTPATIENT_CLINIC_OR_DEPARTMENT_OTHER): Payer: Self-pay | Admitting: Nurse Practitioner

## 2022-02-07 DIAGNOSIS — F902 Attention-deficit hyperactivity disorder, combined type: Secondary | ICD-10-CM

## 2022-02-08 ENCOUNTER — Other Ambulatory Visit (HOSPITAL_BASED_OUTPATIENT_CLINIC_OR_DEPARTMENT_OTHER): Payer: Self-pay

## 2022-02-08 MED ORDER — AMPHETAMINE-DEXTROAMPHETAMINE 10 MG PO TABS
10.0000 mg | ORAL_TABLET | Freq: Two times a day (BID) | ORAL | 0 refills | Status: DC
  Filled 2022-02-08: qty 60, 30d supply, fill #0

## 2022-02-22 ENCOUNTER — Other Ambulatory Visit (HOSPITAL_BASED_OUTPATIENT_CLINIC_OR_DEPARTMENT_OTHER): Payer: Self-pay | Admitting: Neurosurgery

## 2022-02-22 DIAGNOSIS — G950 Syringomyelia and syringobulbia: Secondary | ICD-10-CM

## 2022-03-23 ENCOUNTER — Ambulatory Visit (HOSPITAL_BASED_OUTPATIENT_CLINIC_OR_DEPARTMENT_OTHER): Payer: 59

## 2022-04-07 ENCOUNTER — Ambulatory Visit (HOSPITAL_BASED_OUTPATIENT_CLINIC_OR_DEPARTMENT_OTHER)
Admission: RE | Admit: 2022-04-07 | Discharge: 2022-04-07 | Disposition: A | Discharge status: Home / Self Care | Payer: 59 | Source: Ambulatory Visit | Attending: Neurosurgery | Admitting: Neurosurgery

## 2022-04-07 DIAGNOSIS — M4802 Spinal stenosis, cervical region: Secondary | ICD-10-CM | POA: Diagnosis not present

## 2022-04-07 DIAGNOSIS — R609 Edema, unspecified: Secondary | ICD-10-CM | POA: Insufficient documentation

## 2022-04-07 DIAGNOSIS — G950 Syringomyelia and syringobulbia: Secondary | ICD-10-CM | POA: Insufficient documentation

## 2022-04-07 DIAGNOSIS — M2578 Osteophyte, vertebrae: Secondary | ICD-10-CM | POA: Insufficient documentation

## 2022-05-05 ENCOUNTER — Other Ambulatory Visit (HOSPITAL_BASED_OUTPATIENT_CLINIC_OR_DEPARTMENT_OTHER): Payer: Self-pay

## 2022-05-05 MED ORDER — BENZONATATE 200 MG PO CAPS
200.0000 mg | ORAL_CAPSULE | Freq: Three times a day (TID) | ORAL | 0 refills | Status: DC
  Filled 2022-05-05: qty 21, 7d supply, fill #0

## 2022-05-05 MED ORDER — FLUTICASONE PROPIONATE 50 MCG/ACT NA SUSP
1.0000 | Freq: Two times a day (BID) | NASAL | 0 refills | Status: DC
  Filled 2022-05-05: qty 16, 30d supply, fill #0

## 2022-06-02 ENCOUNTER — Ambulatory Visit (HOSPITAL_BASED_OUTPATIENT_CLINIC_OR_DEPARTMENT_OTHER): Payer: 59 | Admitting: Orthopaedic Surgery

## 2022-06-17 ENCOUNTER — Other Ambulatory Visit (HOSPITAL_BASED_OUTPATIENT_CLINIC_OR_DEPARTMENT_OTHER): Payer: Self-pay

## 2022-07-27 ENCOUNTER — Ambulatory Visit (HOSPITAL_BASED_OUTPATIENT_CLINIC_OR_DEPARTMENT_OTHER): Payer: 59 | Admitting: Family Medicine

## 2022-07-27 ENCOUNTER — Encounter (HOSPITAL_BASED_OUTPATIENT_CLINIC_OR_DEPARTMENT_OTHER): Payer: Self-pay | Admitting: Family Medicine

## 2022-07-27 VITALS — BP 110/79 | HR 89 | Temp 97.9°F | Ht 67.0 in | Wt 157.3 lb

## 2022-07-27 DIAGNOSIS — R1011 Right upper quadrant pain: Secondary | ICD-10-CM

## 2022-07-27 NOTE — Progress Notes (Signed)
   Acute Office Visit  Subjective:     Patient ID: Tammie Frederick, female    DOB: 1980/04/12, 42 y.o.   MRN: [id number]  Chief Complaint  Patient presents with   Abdominal Pain    Pt here for having abdominal pain on her right side, pt stated it started on this on Monday, she stated certain movements cause the sharp dull pain    Tammie Frederick is a 42 yo female patient who presents today for 4 days of RUQ pain/tenderness. Reports that it started on Monday/Tuesday. It was very uncomfortable and hurt more with a deep breath and with stretching. Wednesday the pain was better but still pain with certain movements. Had pancreatitis in the past, but did not feel similar. She reports taking robaxin for a prior back injury and reports this did not help improve the pain.   Denies N/V, diarrhea, fever/chills, change in appetite.    Review of Systems  Constitutional:  Negative for chills, fever, malaise/fatigue and weight loss.  Respiratory:  Negative for cough and shortness of breath.   Cardiovascular:  Negative for chest pain and palpitations.  Gastrointestinal:  Positive for abdominal pain (RUQ). Negative for nausea and vomiting.  Musculoskeletal:  Negative for myalgias.  Neurological:  Negative for headaches.       Objective:  BP 110/79 (BP Location: Left Arm, Patient Position: Sitting, Cuff Size: Normal)   Pulse 89   Temp 97.9 F (36.6 C) (Oral)   Ht 5\' 7"  (1.702 m)   Wt 157 lb 4.8 oz (71.4 kg)   SpO2 99%   BMI 24.64 kg/m   Physical Exam Constitutional:      Appearance: Normal appearance.  Cardiovascular:     Rate and Rhythm: Normal rate and regular rhythm.     Heart sounds: Normal heart sounds.  Pulmonary:     Effort: Pulmonary effort is normal.     Breath sounds: Normal breath sounds.  Abdominal:     General: Bowel sounds are normal. There is no distension.     Palpations: Abdomen is soft.     Tenderness: There is abdominal tenderness in the right upper quadrant. There is  no guarding or rebound.  Neurological:     Mental Status: She is alert.       Assessment & Plan:  1. RUQ abdominal pain Patient presents with abdominal tenderness in the RUQ. She describes this pain as starting off at the beginning of the week as a sharp pain and now it is a dull, aching pain. Denies N/V, diarrhea, fever/chills, change in appetite, changes in bowel habits, dysuria, urinary frequency, hematuria, or syncopal episodes. Patient is well-appearing and in no acute distress. Cardiovascular exam with heart regular rate and rhythm. Normal heart sounds, no murmurs present. Lungs clear to auscultation bilaterally.  Abdominal exam reveals tenderness present in the RUQ. No rebound tenderness or guarding present.  Initial testing will include assessment of liver, pancreas, and kidney function, Will also obtain CBC to rule out infection. Advised patient that further work-up could involve imaging of RUQ. She is agreeable to plan of care. Advised her to seek emergency or urgent care over the weekend if she develops worsening abdominal pain, nausea, vomiting, fever, chills. Will update patient with lab results.   Return in about 3 months (around 10/27/2022) for Physical with fasting labs.  Alyson Reedy, FNP

## 2022-07-28 LAB — COMPREHENSIVE METABOLIC PANEL
ALT: 11 IU/L (ref 0–32)
AST: 15 IU/L (ref 0–40)
Albumin/Globulin Ratio: 1.6 (ref 1.2–2.2)
Albumin: 4.4 g/dL (ref 3.9–4.9)
Alkaline Phosphatase: 92 IU/L (ref 44–121)
BUN/Creatinine Ratio: 9 (ref 9–23)
BUN: 8 mg/dL (ref 6–24)
Bilirubin Total: 0.4 mg/dL (ref 0.0–1.2)
CO2: 22 mmol/L (ref 20–29)
Calcium: 9.4 mg/dL (ref 8.7–10.2)
Chloride: 104 mmol/L (ref 96–106)
Creatinine, Ser: 0.9 mg/dL (ref 0.57–1.00)
Globulin, Total: 2.8 g/dL (ref 1.5–4.5)
Glucose: 88 mg/dL (ref 70–99)
Potassium: 4.7 mmol/L (ref 3.5–5.2)
Sodium: 141 mmol/L (ref 134–144)
Total Protein: 7.2 g/dL (ref 6.0–8.5)
eGFR: 83 mL/min/{1.73_m2} (ref >59)

## 2022-07-28 LAB — CBC WITH DIFFERENTIAL/PLATELET
Basophils Absolute: 0.1 10*3/uL (ref 0.0–0.2)
Basos: 1 %
EOS (ABSOLUTE): 0.1 10*3/uL (ref 0.0–0.4)
Eos: 1 %
Hematocrit: 45 % (ref 34.0–46.6)
Hemoglobin: 14.6 g/dL (ref 11.1–15.9)
Immature Grans (Abs): 0 10*3/uL (ref 0.0–0.1)
Immature Granulocytes: 0 %
Lymphocytes Absolute: 2.6 10*3/uL (ref 0.7–3.1)
Lymphs: 34 %
MCH: 28.3 pg (ref 26.6–33.0)
MCHC: 32.4 g/dL (ref 31.5–35.7)
MCV: 87 fL (ref 79–97)
Monocytes Absolute: 0.8 10*3/uL (ref 0.1–0.9)
Monocytes: 10 %
Neutrophils Absolute: 4 10*3/uL (ref 1.4–7.0)
Neutrophils: 54 %
Platelets: 472 10*3/uL — ABNORMAL HIGH (ref 150–450)
RBC: 5.15 x10E6/uL (ref 3.77–5.28)
RDW: 12.2 % (ref 11.7–15.4)
WBC: 7.5 10*3/uL (ref 3.4–10.8)

## 2022-07-28 LAB — HEPATIC FUNCTION PANEL: Bilirubin, Direct: 0.13 mg/dL (ref 0.00–0.40)

## 2022-07-28 LAB — AMYLASE: Amylase: 62 U/L (ref 31–110)

## 2022-08-29 ENCOUNTER — Other Ambulatory Visit (HOSPITAL_BASED_OUTPATIENT_CLINIC_OR_DEPARTMENT_OTHER): Payer: Self-pay | Admitting: Nurse Practitioner

## 2022-08-29 DIAGNOSIS — F902 Attention-deficit hyperactivity disorder, combined type: Secondary | ICD-10-CM

## 2022-08-30 ENCOUNTER — Other Ambulatory Visit (HOSPITAL_BASED_OUTPATIENT_CLINIC_OR_DEPARTMENT_OTHER): Payer: Self-pay | Admitting: Family Medicine

## 2022-08-31 ENCOUNTER — Telehealth (HOSPITAL_BASED_OUTPATIENT_CLINIC_OR_DEPARTMENT_OTHER): Payer: Self-pay | Admitting: Family Medicine

## 2022-08-31 NOTE — Telephone Encounter (Signed)
Spoke with patient, she is scheduled for a visit 06/10

## 2022-08-31 NOTE — Telephone Encounter (Signed)
Patient is requesting a refill for adderall, states Tammie Frederick had her taking it on an as needed basis to start off and "see how it works for her" but now she is taking it on an every day basis. Last refill was in November, let patient know if she needs a follow up appointment before refill I will reach back out to her.

## 2022-08-31 NOTE — Telephone Encounter (Signed)
Patient called back about medication adderall.Tammie KitchenMarland KitchenMarland Frederick

## 2022-09-04 ENCOUNTER — Other Ambulatory Visit (HOSPITAL_BASED_OUTPATIENT_CLINIC_OR_DEPARTMENT_OTHER): Payer: Self-pay

## 2022-09-04 ENCOUNTER — Telehealth (HOSPITAL_BASED_OUTPATIENT_CLINIC_OR_DEPARTMENT_OTHER): Payer: 59 | Admitting: Family Medicine

## 2022-09-04 ENCOUNTER — Encounter (HOSPITAL_BASED_OUTPATIENT_CLINIC_OR_DEPARTMENT_OTHER): Payer: Self-pay | Admitting: Family Medicine

## 2022-09-04 DIAGNOSIS — F902 Attention-deficit hyperactivity disorder, combined type: Secondary | ICD-10-CM | POA: Diagnosis not present

## 2022-09-04 MED ORDER — AMPHETAMINE-DEXTROAMPHETAMINE 10 MG PO TABS
10.0000 mg | ORAL_TABLET | Freq: Every day | ORAL | 0 refills | Status: DC
  Filled 2022-11-12: qty 30, 30d supply, fill #0

## 2022-09-04 MED ORDER — AMPHETAMINE-DEXTROAMPHETAMINE 10 MG PO TABS
10.0000 mg | ORAL_TABLET | Freq: Every day | ORAL | 0 refills | Status: DC
  Filled 2022-09-04: qty 30, 30d supply, fill #0

## 2022-09-04 MED ORDER — AMPHETAMINE-DEXTROAMPHETAMINE 10 MG PO TABS
10.0000 mg | ORAL_TABLET | Freq: Every day | ORAL | 0 refills | Status: DC
  Filled 2022-10-06 – 2022-10-13 (×2): qty 30, 30d supply, fill #0

## 2022-09-04 NOTE — Progress Notes (Signed)
   Established Patient Office Visit  Subjective   Patient ID: Tammie Frederick, female    DOB: Dec 16, 1980  Age: 42 y.o. MRN: [id number]  Virtual Visit via Video Note  I connected with Tammie Frederick on 09/04/22 at  1:10 PM EDT by a video enabled telemedicine application and verified that I am speaking with the correct person using two identifiers.  Location: Patient: Home Provider: Office/Clinic   I discussed the limitations of evaluation and management by telemedicine and the availability of in person appointments. The patient expressed understanding and agreed to proceed.  Tammie Frederick is a 42 year-old female patient who presents today for follow-up regarding ADHD. Patient last seen 12/05/2021 for ADHD. Patient was prescribed Adderall 10mg  immediate release twice daily. Patient reports taking it twice a day and had issues with insomnia on the days she was taking the 2nd dose. She reports she has been taking 10mg  once daily with adequate control of her inattention and concentration.   Denies chest pain, shortness of breath, palpitations, insomnia, increased anxiety.   Review of Systems  Constitutional:  Negative for weight loss.  Eyes:  Negative for blurred vision and double vision.  Respiratory:  Negative for cough and shortness of breath.   Cardiovascular:  Negative for chest pain and palpitations.  Gastrointestinal:  Negative for nausea and vomiting.  Neurological:  Negative for dizziness and headaches.  Psychiatric/Behavioral:  Negative for depression and suicidal ideas. The patient is not nervous/anxious and does not have insomnia.       Objective:    There were no vitals taken for this visit. BP Readings from Last 3 Encounters:  07/27/22 110/79  12/05/21 114/73  05/23/21 117/72     Physical Exam: limited due to visit being virtual  Patient is able to speak in full sentences without shortness of breath. Patient is well-appearing and in no acute distress. She is able to  answer all questions appropriately.     Assessment & Plan:    1. Attention deficit hyperactivity disorder (ADHD), combined type Patient presents today for ADHD follow-up. She is currently taking Adderall 10mg  daily as prescribed. She reports her concentration is well controlled on this dose. Denies adverse medication side effects. Denies chest pain, shortness of breath, increase in anxiety, and palpitations. Patient would like to continue with this dose. PDMP reviewed, no red flags present. Refill provided today. Advised patient to follow-up in 3 months for ADHD follow-up.  - amphetamine-dextroamphetamine (ADDERALL) 10 MG tablet; Take 1 tablet (10 mg total) by mouth daily with breakfast.  Dispense: 30 tablet; Refill: 0  I discussed the assessment and treatment plan with the patient. The patient was provided an opportunity to ask questions and all were answered. The patient agreed with the plan and demonstrated an understanding of the instructions.   The patient was advised to call back or seek an in-person evaluation if the symptoms worsen or if the condition fails to improve as anticipated.   Return in about 3 months (around 12/01/2022) for ADHD f/u (virtual).    Alyson Reedy, FNP

## 2022-10-06 ENCOUNTER — Other Ambulatory Visit (HOSPITAL_BASED_OUTPATIENT_CLINIC_OR_DEPARTMENT_OTHER): Payer: Self-pay

## 2022-10-13 ENCOUNTER — Other Ambulatory Visit (HOSPITAL_BASED_OUTPATIENT_CLINIC_OR_DEPARTMENT_OTHER): Payer: Self-pay

## 2022-11-13 ENCOUNTER — Other Ambulatory Visit: Payer: Self-pay

## 2022-11-13 ENCOUNTER — Other Ambulatory Visit (HOSPITAL_BASED_OUTPATIENT_CLINIC_OR_DEPARTMENT_OTHER): Payer: Self-pay

## 2022-12-07 ENCOUNTER — Other Ambulatory Visit (HOSPITAL_COMMUNITY)
Admission: RE | Admit: 2022-12-07 | Discharge: 2022-12-07 | Disposition: A | Discharge status: Home / Self Care | Payer: 59 | Source: Ambulatory Visit | Attending: Obstetrics & Gynecology | Admitting: Obstetrics & Gynecology

## 2022-12-07 ENCOUNTER — Encounter (HOSPITAL_BASED_OUTPATIENT_CLINIC_OR_DEPARTMENT_OTHER): Payer: Self-pay | Admitting: Obstetrics & Gynecology

## 2022-12-07 ENCOUNTER — Ambulatory Visit (HOSPITAL_BASED_OUTPATIENT_CLINIC_OR_DEPARTMENT_OTHER): Payer: 59 | Admitting: Obstetrics & Gynecology

## 2022-12-07 VITALS — BP 112/84 | HR 70 | Ht 67.0 in | Wt 154.8 lb

## 2022-12-07 DIAGNOSIS — Z124 Encounter for screening for malignant neoplasm of cervix: Secondary | ICD-10-CM | POA: Diagnosis present

## 2022-12-07 DIAGNOSIS — Z1231 Encounter for screening mammogram for malignant neoplasm of breast: Secondary | ICD-10-CM | POA: Diagnosis not present

## 2022-12-07 DIAGNOSIS — N920 Excessive and frequent menstruation with regular cycle: Secondary | ICD-10-CM

## 2022-12-07 DIAGNOSIS — Z01419 Encounter for gynecological examination (general) (routine) without abnormal findings: Secondary | ICD-10-CM

## 2022-12-07 NOTE — Progress Notes (Signed)
42 y.o. G78P1011 Single White or Caucasian female here for annual exam/new patient exam.  Cycles are regular with significant cramping with heavy flow on days 3 and 4.  Flow, all total lasts 7-9 days.  Has been on birth control and used nuva ring in the past.  Had a lot of emotions with hormonal therapy so would prefer to not use hormonal therapy.  Has some interest in considering options for no bleeding.    She has a hx of compression fracture due to being thrown from a horse.  She does have chronic pain but mostly this is managed by using robaxin.  Does use celebrex on occasion.    Had Paraguard IUD for contraception.  Patient's last menstrual period was 11/22/2022 (exact date).          Sexually active: No.  The current method of family planning is abstinence.    Smoker:  no  Health Maintenance: Pap:  updated today History of abnormal Pap:  no MMG:  ordered Colonoscopy:  done around 2018 due to weight loss Screening Labs: 07/2022   reports that she has never smoked. She has never used smokeless tobacco. She reports that she does not currently use alcohol. She reports that she does not use drugs.  Past Medical History:  Diagnosis Date   AMA (advanced maternal age) multigravida 35+ 06/30/2019   Anxiety    Asthma    occasional inhaler use   Back injury    Broken finger    H/O cone biopsy of cervix 03/27/2009   H/O sinus surgery    History of back surgery    History of vertebral fracture 05/05/2016   HPV (human papilloma virus) anogenital infection    IUGR (intrauterine growth restriction) affecting care of mother 04/15/2019   LGSIL on Pap smear of cervix 04/14/2019   Preterm labor    PTL starting @ 28 wks   Short cervical length during pregnancy 04/14/2019   Threatened preterm labor 04/15/2019   Vaginal Pap smear, abnormal     Past Surgical History:  Procedure Laterality Date   BACK SURGERY     CERVICAL BIOPSY  W/ LOOP ELECTRODE EXCISION     FRACTURE SURGERY     fixation  of fracture using dynamic compression system   NASAL SINUS SURGERY     multiple    Current Outpatient Medications  Medication Sig Dispense Refill   amphetamine-dextroamphetamine (ADDERALL) 10 MG tablet Take 1 tablet (10 mg total) by mouth daily with breakfast. 30 tablet 0   celecoxib (CELEBREX) 100 MG capsule Take 1 capsule (100 mg total) by mouth 2 (two) times daily. 60 capsule 6   loratadine (CLARITIN) 10 MG tablet Take by mouth.     methocarbamol (ROBAXIN-750) 750 MG tablet Take 750 mg by mouth 4 (four) times daily.     amphetamine-dextroamphetamine (ADDERALL) 10 MG tablet Take 1 tablet (10 mg total) by mouth daily with breakfast. (Patient not taking: Reported on 12/07/2022) 30 tablet 0   amphetamine-dextroamphetamine (ADDERALL) 10 MG tablet Take 1 tablet (10 mg total) by mouth daily with breakfast. (Patient not taking: Reported on 12/07/2022) 30 tablet 0   No current facility-administered medications for this visit.    Family History  Problem Relation Age of Onset   Hyperlipidemia Mother    Depression Father    Anxiety disorder Father    Breast cancer Maternal Grandmother    Lung cancer Maternal Grandmother    Diabetes Paternal Grandfather     ROS: Constitutional: negative Genitourinary:negative  Exam:   BP 112/84 (BP Location: Right Arm, Patient Position: Sitting, Cuff Size: Normal)   Pulse 70   Ht 5\' 7"  (1.702 m)   Wt 154 lb 12.8 oz (70.2 kg)   LMP 11/22/2022 (Exact Date)   Breastfeeding No   BMI 24.25 kg/m   Height: 5\' 7"  (170.2 cm)  General appearance: alert, cooperative and appears stated age Head: Normocephalic, without obvious abnormality, atraumatic Neck: no adenopathy, supple, symmetrical, trachea midline and thyroid normal to inspection and palpation Lungs: clear to auscultation bilaterally Breasts: normal appearance, no masses or tenderness Heart: regular rate and rhythm Abdomen: soft, non-tender; bowel sounds normal; no masses,  no  organomegaly Extremities: extremities normal, atraumatic, no cyanosis or edema Skin: Skin color, texture, turgor normal. No rashes or lesions Lymph nodes: Cervical, supraclavicular, and axillary nodes normal. No abnormal inguinal nodes palpated Neurologic: Grossly normal   Pelvic: External genitalia:  no lesions              Urethra:  normal appearing urethra with no masses, tenderness or lesions              Bartholins and Skenes: normal                 Vagina: normal appearing vagina with normal color and no discharge, no lesions              Cervix: no lesions, IUD string noted              Pap taken: Yes.   Bimanual Exam:  Uterus:  normal size, contour, position, consistency, mobility, non-tender              Adnexa: normal adnexa and no mass, fullness, tenderness               Rectovaginal: Confirms               Anus:  normal sphincter tone, no lesions  Chaperone, Hendricks Milo, CMA, was present for exam.  Assessment/Plan: 1. Well woman exam with routine gynecological exam - Pap smear with HR HPV obtained today - Mammogram ordered - Colonoscopy guidelines reviewed - lab work done with PCP - vaccines reviewed/updated  2. Menorrhagia with regular cycle - US PELVIS TRANSVAGINAL NON-OB (TV ONLY); Future - pt interested in hysterectomy and willing to proceed with evaluation  3. Encounter for screening mammogram for malignant neoplasm of breast - MM 3D SCREENING MAMMOGRAM BILATERAL BREAST; Future  4. Cervical cancer screening - Cytology - PAP( [place])

## 2022-12-07 NOTE — Patient Instructions (Signed)
Call [phone] to schedule an appointment at [facility].    Mammograms can also be self-scheduled online through MyChart.  

## 2022-12-11 LAB — CYTOLOGY - PAP
Comment: NEGATIVE
Diagnosis: NEGATIVE
High risk HPV: NEGATIVE

## 2022-12-12 ENCOUNTER — Other Ambulatory Visit (HOSPITAL_BASED_OUTPATIENT_CLINIC_OR_DEPARTMENT_OTHER): Payer: Self-pay

## 2022-12-12 ENCOUNTER — Other Ambulatory Visit (HOSPITAL_BASED_OUTPATIENT_CLINIC_OR_DEPARTMENT_OTHER): Payer: Self-pay | Admitting: Family Medicine

## 2022-12-12 DIAGNOSIS — F902 Attention-deficit hyperactivity disorder, combined type: Secondary | ICD-10-CM

## 2022-12-12 MED ORDER — AMPHETAMINE-DEXTROAMPHETAMINE 10 MG PO TABS
10.0000 mg | ORAL_TABLET | Freq: Every day | ORAL | 0 refills | Status: DC
  Filled 2022-12-12: qty 30, 30d supply, fill #0

## 2022-12-12 NOTE — Telephone Encounter (Signed)
PDMP reviewed. No red flags present.

## 2022-12-16 ENCOUNTER — Ambulatory Visit (HOSPITAL_BASED_OUTPATIENT_CLINIC_OR_DEPARTMENT_OTHER)
Admission: RE | Admit: 2022-12-16 | Discharge: 2022-12-16 | Disposition: A | Discharge status: Home / Self Care | Payer: 59 | Source: Ambulatory Visit | Attending: Obstetrics & Gynecology | Admitting: Obstetrics & Gynecology

## 2022-12-16 DIAGNOSIS — Z1231 Encounter for screening mammogram for malignant neoplasm of breast: Secondary | ICD-10-CM | POA: Diagnosis present

## 2022-12-27 ENCOUNTER — Ambulatory Visit (HOSPITAL_BASED_OUTPATIENT_CLINIC_OR_DEPARTMENT_OTHER): Payer: 59

## 2022-12-27 ENCOUNTER — Encounter (HOSPITAL_BASED_OUTPATIENT_CLINIC_OR_DEPARTMENT_OTHER): Payer: Self-pay | Admitting: Obstetrics & Gynecology

## 2022-12-27 ENCOUNTER — Ambulatory Visit (HOSPITAL_BASED_OUTPATIENT_CLINIC_OR_DEPARTMENT_OTHER): Payer: 59 | Admitting: Obstetrics & Gynecology

## 2022-12-27 VITALS — BP 118/80 | HR 80 | Ht 67.0 in | Wt 155.6 lb

## 2022-12-27 DIAGNOSIS — N920 Excessive and frequent menstruation with regular cycle: Secondary | ICD-10-CM

## 2022-12-27 DIAGNOSIS — Z975 Presence of (intrauterine) contraceptive device: Secondary | ICD-10-CM

## 2022-12-27 NOTE — Progress Notes (Signed)
GYNECOLOGY  VISIT  CC:   menorrhagia, follow up after ultrasound  HPI: 42 y.o. G37P1011 Single White or Caucasian female here for discussion of results of ultrasound.  Uterus measured 6.8 x 4.3 x 2.8cm.  Endometrium 6.65mm.  IUD (copper) in correct location.  Ovaries normal.  She is interested in treatment for bleeding and she does not want to be on hormonal therapy.  Has been considering hysterectomy.  Other options discussed.  For now, she is going to monitor and will let me know if/when makes a decision.  She is pleased to know there are no abnormal findings today.   Past Medical History:  Diagnosis Date   AMA (advanced maternal age) multigravida 35+ 06/30/2019   Anxiety    Asthma    occasional inhaler use   Back injury    Broken finger    H/O cone biopsy of cervix 03/27/2009   H/O sinus surgery    History of back surgery    History of vertebral fracture 05/05/2016   HPV (human papilloma virus) anogenital infection    IUGR (intrauterine growth restriction) affecting care of mother 04/15/2019   LGSIL on Pap smear of cervix 04/14/2019   Preterm labor    PTL starting @ 28 wks   Short cervical length during pregnancy 04/14/2019   Threatened preterm labor 04/15/2019   Vaginal Pap smear, abnormal     MEDS:   Current Outpatient Medications on File Prior to Visit  Medication Sig Dispense Refill   amphetamine-dextroamphetamine (ADDERALL) 10 MG tablet Take 1 tablet (10 mg total) by mouth daily with breakfast. 30 tablet 0   celecoxib (CELEBREX) 100 MG capsule Take 1 capsule (100 mg total) by mouth 2 (two) times daily. 60 capsule 6   loratadine (CLARITIN) 10 MG tablet Take by mouth.     methocarbamol (ROBAXIN-750) 750 MG tablet Take 750 mg by mouth 4 (four) times daily.     No current facility-administered medications on file prior to visit.    ALLERGIES: Sulfa antibiotics, Cetirizine, Latex, Cauliflower [brassica oleracea], and Azithromycin  SH: single, non smoker  Review of  Systems  Constitutional: Negative.   Genitourinary:        History of menorrhagia     PHYSICAL EXAMINATION:    BP 118/80 (BP Location: Left Arm, Patient Position: Sitting, Cuff Size: Normal)   Pulse 80   Ht 5\' 7"  (1.702 m)   Wt 155 lb 9.6 oz (70.6 kg)   LMP 11/22/2022 (Exact Date)   BMI 24.37 kg/m     Physical Exam Constitutional:      Appearance: Normal appearance.  Neurological:     General: No focal deficit present.  Psychiatric:        Mood and Affect: Mood normal.      Assessment/Plan: 1. Menorrhagia with regular cycle -  pt continuing to consider options.  Will let me know if/when makes decision  2. IUD (intrauterine device) in place

## 2023-01-01 ENCOUNTER — Encounter (HOSPITAL_BASED_OUTPATIENT_CLINIC_OR_DEPARTMENT_OTHER): Payer: Self-pay | Admitting: Obstetrics & Gynecology

## 2023-01-25 ENCOUNTER — Other Ambulatory Visit (HOSPITAL_BASED_OUTPATIENT_CLINIC_OR_DEPARTMENT_OTHER): Payer: Self-pay | Admitting: Family Medicine

## 2023-01-25 DIAGNOSIS — F902 Attention-deficit hyperactivity disorder, combined type: Secondary | ICD-10-CM

## 2023-01-30 ENCOUNTER — Other Ambulatory Visit (HOSPITAL_BASED_OUTPATIENT_CLINIC_OR_DEPARTMENT_OTHER): Payer: Self-pay

## 2023-01-30 ENCOUNTER — Encounter (HOSPITAL_BASED_OUTPATIENT_CLINIC_OR_DEPARTMENT_OTHER): Payer: Self-pay | Admitting: Family Medicine

## 2023-01-30 ENCOUNTER — Other Ambulatory Visit (HOSPITAL_BASED_OUTPATIENT_CLINIC_OR_DEPARTMENT_OTHER): Payer: Self-pay | Admitting: Family Medicine

## 2023-01-30 MED ORDER — AMPHETAMINE-DEXTROAMPHETAMINE 10 MG PO TABS
10.0000 mg | ORAL_TABLET | Freq: Every day | ORAL | 0 refills | Status: DC
  Filled 2023-01-30: qty 30, 30d supply, fill #0

## 2023-01-30 NOTE — Telephone Encounter (Signed)
PDMP reviewed. No red flags present. Refill sent.

## 2023-02-12 ENCOUNTER — Ambulatory Visit (HOSPITAL_BASED_OUTPATIENT_CLINIC_OR_DEPARTMENT_OTHER): Payer: 59

## 2023-02-12 ENCOUNTER — Ambulatory Visit (HOSPITAL_BASED_OUTPATIENT_CLINIC_OR_DEPARTMENT_OTHER): Payer: 59 | Admitting: Family Medicine

## 2023-02-12 ENCOUNTER — Encounter (HOSPITAL_BASED_OUTPATIENT_CLINIC_OR_DEPARTMENT_OTHER): Payer: Self-pay | Admitting: Family Medicine

## 2023-02-12 ENCOUNTER — Other Ambulatory Visit (HOSPITAL_BASED_OUTPATIENT_CLINIC_OR_DEPARTMENT_OTHER): Payer: Self-pay

## 2023-02-12 VITALS — BP 116/90 | HR 80 | Ht 67.0 in | Wt 154.4 lb

## 2023-02-12 DIAGNOSIS — R051 Acute cough: Secondary | ICD-10-CM

## 2023-02-12 MED ORDER — HYDROCODONE BIT-HOMATROP MBR 5-1.5 MG/5ML PO SOLN
5.0000 mL | Freq: Four times a day (QID) | ORAL | 0 refills | Status: DC | PRN
Start: 2023-02-12 — End: 2023-06-07
  Filled 2023-02-12: qty 120, 6d supply, fill #0

## 2023-02-12 MED ORDER — PREDNISONE 20 MG PO TABS
20.0000 mg | ORAL_TABLET | Freq: Every day | ORAL | 0 refills | Status: AC
  Filled 2023-02-12: qty 5, 5d supply, fill #0

## 2023-02-12 MED ORDER — AZITHROMYCIN 250 MG PO TABS
ORAL_TABLET | ORAL | 0 refills | Status: AC
Start: 2023-02-12 — End: 2023-02-17
  Filled 2023-02-12: qty 6, 5d supply, fill #0

## 2023-02-12 MED ORDER — IPRATROPIUM BROMIDE 0.02 % IN SOLN
0.5000 mg | Freq: Four times a day (QID) | RESPIRATORY_TRACT | 3 refills | Status: AC
  Filled 2023-02-12: qty 75, 8d supply, fill #0

## 2023-02-12 NOTE — Progress Notes (Signed)
Acute Office Visit  Subjective:     Patient ID: Marilda Mcnabb, female    DOB: [birth year], 42 y.o.   MRN: [id number]  Chief Complaint  Patient presents with   Cough    Ongoing for about 5 days, no colored mucus    Shortness of Breath    Can't take a deep breath, laying down at night makes it worse, can't talk for long periods without cough fits or increased SOB   Neharika Barboza is a 42 year old female patient who presents today for an acute office visit. She reports she has had a cough for the past 5 days with occasional shob. She reports she felt shob when she was talking on the phone at the same time she was getting dressed. Cough is worse at night with chest tightness and wheezing. Reports fatigue, difficulty sleeping d/t excessive coughing. Denies fever/chills, abdominal pain, chest pain.   Review of Systems  Constitutional:  Positive for malaise/fatigue. Negative for chills and fever.  HENT:  Positive for congestion. Negative for ear pain, sinus pain and sore throat.   Eyes:  Negative for blurred vision and double vision.  Respiratory:  Positive for cough, sputum production, shortness of breath and wheezing.   Cardiovascular:  Negative for chest pain, palpitations and leg swelling.  Gastrointestinal:  Negative for abdominal pain, nausea and vomiting.  Genitourinary:  Negative for frequency and urgency.  Musculoskeletal:  Negative for myalgias.  Neurological:  Negative for dizziness, weakness and headaches.  Psychiatric/Behavioral:  Negative for depression. The patient has insomnia (due to coughing fits). The patient is not nervous/anxious.       Objective:    BP (!) 116/90   Pulse 80   Ht 5\' 7"  (1.702 m)   Wt 154 lb 6.4 oz (70 kg)   SpO2 97%   BMI 24.18 kg/m   Physical Exam Vitals reviewed.  Constitutional:      Appearance: Normal appearance. She is well-developed.  Cardiovascular:     Rate and Rhythm: Normal rate and regular rhythm.     Pulses: Normal pulses.      Heart sounds: Normal heart sounds.  Pulmonary:     Effort: Pulmonary effort is normal. No tachypnea, accessory muscle usage or respiratory distress.     Breath sounds: No stridor. Examination of the right-upper field reveals decreased breath sounds and wheezing. Decreased breath sounds and wheezing (slight) present. No rhonchi or rales.  Neurological:     Mental Status: She is alert.    Assessment & Plan:   1. Acute cough Patient is a pleasant 42 year old female patient who presents today for concerns regarding acute cough x5 days. She reports shortness of breath, nasal congestion, wheezing, and fatigue. Deniees diaphoresis, fever/chills, chest pain, N/V, decreased appetite, myalgia. Patient is well appearing and in no acute distress. Physical exam with decreased breath sounds with slight wheezing in right upper lobe posteriorly.  DuoNeb given to patient in office. Wheezing sound improved with treatment, slight decreased breath sound still present in upper lobe. Will obtain CXR to rule out pneumonia. Prescribed prednisone, DuoNeb solution, and cough syrup to help with ongoing cough.  - DG Chest 2 View; Future - predniSONE (DELTASONE) 20 MG tablet; Take 1 tablet (20 mg total) by mouth daily with breakfast for 5 days.  Dispense: 5 tablet; Refill: 0 - HYDROcodone bit-homatropine (HYCODAN) 5-1.5 MG/5ML syrup; Take 5 mLs by mouth every 6 (six) hours as needed for cough.  Dispense: 120 mL; Refill: 0 - azithromycin (ZITHROMAX) 250 MG tablet;  Take 2 tablets on day 1, then 1 tablet daily on days 2 through 5  Dispense: 6 tablet; Refill: 0 - ipratropium (ATROVENT) 0.02 % nebulizer solution; Take 2.5 mLs (0.5 mg total) by nebulization 4 (four) times daily.  Dispense: 75 mL; Refill: 3     Patient presents today   No follow-ups on file.  Alyson Reedy, FNP

## 2023-02-13 DIAGNOSIS — R051 Acute cough: Secondary | ICD-10-CM | POA: Diagnosis not present

## 2023-02-13 MED ORDER — IPRATROPIUM BROMIDE 0.02 % IN SOLN
0.5000 mg | Freq: Once | RESPIRATORY_TRACT | Status: AC
  Administered 2023-02-13: 0.5 mg via RESPIRATORY_TRACT

## 2023-02-15 ENCOUNTER — Encounter (HOSPITAL_BASED_OUTPATIENT_CLINIC_OR_DEPARTMENT_OTHER): Payer: Self-pay | Admitting: Family Medicine

## 2023-02-19 ENCOUNTER — Encounter (HOSPITAL_BASED_OUTPATIENT_CLINIC_OR_DEPARTMENT_OTHER): Payer: Self-pay

## 2023-02-28 ENCOUNTER — Other Ambulatory Visit (HOSPITAL_BASED_OUTPATIENT_CLINIC_OR_DEPARTMENT_OTHER): Payer: Self-pay | Admitting: Family Medicine

## 2023-02-28 DIAGNOSIS — F902 Attention-deficit hyperactivity disorder, combined type: Secondary | ICD-10-CM

## 2023-03-02 MED ORDER — AMPHETAMINE-DEXTROAMPHETAMINE 10 MG PO TABS
10.0000 mg | ORAL_TABLET | Freq: Every day | ORAL | 0 refills | Status: DC
  Filled 2023-03-02: qty 30, 30d supply, fill #0

## 2023-03-02 NOTE — Telephone Encounter (Signed)
PDMP reviewed, no red flags present. Refill sent to pharmacy on file.

## 2023-03-03 ENCOUNTER — Other Ambulatory Visit (HOSPITAL_BASED_OUTPATIENT_CLINIC_OR_DEPARTMENT_OTHER): Payer: Self-pay

## 2023-04-06 ENCOUNTER — Other Ambulatory Visit (HOSPITAL_BASED_OUTPATIENT_CLINIC_OR_DEPARTMENT_OTHER): Payer: Self-pay | Admitting: Family Medicine

## 2023-04-06 DIAGNOSIS — F902 Attention-deficit hyperactivity disorder, combined type: Secondary | ICD-10-CM

## 2023-04-09 ENCOUNTER — Other Ambulatory Visit (HOSPITAL_BASED_OUTPATIENT_CLINIC_OR_DEPARTMENT_OTHER): Payer: Self-pay

## 2023-04-09 MED ORDER — AMPHETAMINE-DEXTROAMPHETAMINE 10 MG PO TABS
10.0000 mg | ORAL_TABLET | Freq: Every day | ORAL | 0 refills | Status: DC
  Filled 2023-04-09: qty 30, 30d supply, fill #0

## 2023-04-09 NOTE — Telephone Encounter (Signed)
PDMP reviewed, no red flags. Refills sent to pharmacy on file.

## 2023-05-11 ENCOUNTER — Other Ambulatory Visit (HOSPITAL_BASED_OUTPATIENT_CLINIC_OR_DEPARTMENT_OTHER): Payer: Self-pay | Admitting: Family Medicine

## 2023-05-11 DIAGNOSIS — F902 Attention-deficit hyperactivity disorder, combined type: Secondary | ICD-10-CM

## 2023-05-14 ENCOUNTER — Other Ambulatory Visit (HOSPITAL_BASED_OUTPATIENT_CLINIC_OR_DEPARTMENT_OTHER): Payer: Self-pay

## 2023-05-14 MED ORDER — AMPHETAMINE-DEXTROAMPHETAMINE 10 MG PO TABS
10.0000 mg | ORAL_TABLET | Freq: Every day | ORAL | 0 refills | Status: DC
  Filled 2023-05-14: qty 30, 30d supply, fill #0

## 2023-06-01 ENCOUNTER — Encounter: Admitting: Family Medicine

## 2023-06-01 ENCOUNTER — Other Ambulatory Visit (HOSPITAL_BASED_OUTPATIENT_CLINIC_OR_DEPARTMENT_OTHER): Payer: Self-pay | Admitting: Family Medicine

## 2023-06-01 DIAGNOSIS — Z13220 Encounter for screening for lipoid disorders: Secondary | ICD-10-CM

## 2023-06-01 DIAGNOSIS — Z0000 Encounter for general adult medical examination without abnormal findings: Secondary | ICD-10-CM

## 2023-06-01 DIAGNOSIS — E041 Nontoxic single thyroid nodule: Secondary | ICD-10-CM

## 2023-06-02 LAB — LIPID PANEL
Chol/HDL Ratio: 2.8 ratio (ref 0.0–4.4)
Cholesterol, Total: 142 mg/dL (ref 100–199)
HDL: 51 mg/dL (ref >39)
LDL Chol Calc (NIH): 76 mg/dL (ref 0–99)
Triglycerides: 78 mg/dL (ref 0–149)
VLDL Cholesterol Cal: 15 mg/dL (ref 5–40)

## 2023-06-02 LAB — CBC WITH DIFFERENTIAL/PLATELET
Basophils Absolute: 0.1 10*3/uL (ref 0.0–0.2)
Basos: 1 %
EOS (ABSOLUTE): 0.2 10*3/uL (ref 0.0–0.4)
Eos: 2 %
Hematocrit: 46.7 % — ABNORMAL HIGH (ref 34.0–46.6)
Hemoglobin: 15 g/dL (ref 11.1–15.9)
Immature Grans (Abs): 0 10*3/uL (ref 0.0–0.1)
Immature Granulocytes: 0 %
Lymphocytes Absolute: 2.7 10*3/uL (ref 0.7–3.1)
Lymphs: 35 %
MCH: 29.4 pg (ref 26.6–33.0)
MCHC: 32.1 g/dL (ref 31.5–35.7)
MCV: 92 fL (ref 79–97)
Monocytes Absolute: 0.8 10*3/uL (ref 0.1–0.9)
Monocytes: 10 %
Neutrophils Absolute: 4 10*3/uL (ref 1.4–7.0)
Neutrophils: 52 %
Platelets: 490 10*3/uL — ABNORMAL HIGH (ref 150–450)
RBC: 5.1 x10E6/uL (ref 3.77–5.28)
RDW: 12.2 % (ref 11.7–15.4)
WBC: 7.7 10*3/uL (ref 3.4–10.8)

## 2023-06-02 LAB — COMPREHENSIVE METABOLIC PANEL
ALT: 10 IU/L (ref 0–32)
AST: 13 IU/L (ref 0–40)
Albumin: 4.2 g/dL (ref 3.9–4.9)
Alkaline Phosphatase: 101 IU/L (ref 44–121)
BUN/Creatinine Ratio: 6 — ABNORMAL LOW (ref 9–23)
BUN: 5 mg/dL — ABNORMAL LOW (ref 6–24)
Bilirubin Total: 0.3 mg/dL (ref 0.0–1.2)
CO2: 24 mmol/L (ref 20–29)
Calcium: 9.4 mg/dL (ref 8.7–10.2)
Chloride: 102 mmol/L (ref 96–106)
Creatinine, Ser: 0.85 mg/dL (ref 0.57–1.00)
Globulin, Total: 2.6 g/dL (ref 1.5–4.5)
Glucose: 85 mg/dL (ref 70–99)
Potassium: 4.3 mmol/L (ref 3.5–5.2)
Sodium: 137 mmol/L (ref 134–144)
Total Protein: 6.8 g/dL (ref 6.0–8.5)
eGFR: 88 mL/min/{1.73_m2} (ref >59)

## 2023-06-02 LAB — TSH: TSH: 1.48 u[IU]/mL (ref 0.450–4.500)

## 2023-06-07 ENCOUNTER — Other Ambulatory Visit (HOSPITAL_BASED_OUTPATIENT_CLINIC_OR_DEPARTMENT_OTHER): Payer: Self-pay | Admitting: Family Medicine

## 2023-06-07 ENCOUNTER — Ambulatory Visit (HOSPITAL_BASED_OUTPATIENT_CLINIC_OR_DEPARTMENT_OTHER): Admitting: Family Medicine

## 2023-06-07 ENCOUNTER — Encounter (HOSPITAL_BASED_OUTPATIENT_CLINIC_OR_DEPARTMENT_OTHER): Payer: Self-pay | Admitting: Family Medicine

## 2023-06-07 VITALS — BP 115/70 | HR 78 | Ht 67.0 in | Wt 157.0 lb

## 2023-06-07 DIAGNOSIS — F902 Attention-deficit hyperactivity disorder, combined type: Secondary | ICD-10-CM

## 2023-06-07 DIAGNOSIS — Z0000 Encounter for general adult medical examination without abnormal findings: Secondary | ICD-10-CM | POA: Diagnosis not present

## 2023-06-07 DIAGNOSIS — E010 Iodine-deficiency related diffuse (endemic) goiter: Secondary | ICD-10-CM | POA: Diagnosis not present

## 2023-06-07 DIAGNOSIS — D75839 Thrombocytosis, unspecified: Secondary | ICD-10-CM | POA: Insufficient documentation

## 2023-06-07 LAB — POCT URINE DRUG SCREEN
Morphine: NOT DETECTED
POC Cocaine UR: NOT DETECTED
POC Marijuana UR: NOT DETECTED
POC Opiate Ur: NOT DETECTED
POC Oxycodone UR: NOT DETECTED

## 2023-06-07 NOTE — Progress Notes (Signed)
 Subjective:   Tammie Frederick [birth year]  06/07/2023   CC: Chief Complaint  Patient presents with   Annual Exam    Patient is here today for her physical. Denies any main concerns for today's visit.    HPI: Tammie Frederick is a 43 y.o. female who presents for a routine health maintenance exam.  Labs collected at time of visit.   Patient does want PCP to perform assessment of thyroid.  Patient has noticed mild enlargement on camera and photos recently of her thyroid gland.  She reports a family history of thyroid issues.   HEALTH SCREENINGS: - Vision Screening: up to date - Dental Visits:  Recommended - Pap smear: up to date - Breast Exam: up to date - STD Screening: Declined - Mammogram (40+): Up to date  - Colonoscopy (45+): Not applicable  - Bone Density (65+ or under 65 with predisposing conditions): Not applicable  - Lung CA screening with low-dose CT:  Not applicable Adults age 24-80 who are current cigarette smokers or quit within the last 15 years. Must have 20 pack year history.   Depression and Anxiety Screen done today and results listed below:     06/07/2023   10:37 AM 12/07/2022   10:09 AM 07/27/2022   11:16 AM 12/05/2021    2:39 PM 12/27/2020    1:42 PM  Depression screen PHQ 2/9  Decreased Interest 0 0 0 0 0  Down, Depressed, Hopeless 1 0 0 1 0  PHQ - 2 Score 1 0 0 1 0  Altered sleeping 2   1 0  Tired, decreased energy 1   1 2   Change in appetite 2   1 0  Feeling bad or failure about yourself  0   0 0  Trouble concentrating 0   1 0  Moving slowly or fidgety/restless 0   0 0  Suicidal thoughts 0   0 0  PHQ-9 Score 6   5 2   Difficult doing work/chores Not difficult at all   Somewhat difficult Somewhat difficult      06/07/2023   10:38 AM 07/27/2022   11:16 AM 12/27/2020    1:42 PM  GAD 7 : Generalized Anxiety Score  Nervous, Anxious, on Edge 3 0 1  Control/stop worrying 2 0 0  Worry too much - different things 0 0 1  Trouble relaxing 0 0 1  Restless 0  0 0  Easily annoyed or irritable 1 0 1  Afraid - awful might happen 0 0 0  Total GAD 7 Score 6 0 4  Anxiety Difficulty Not difficult at all Not difficult at all Not difficult at all    IMMUNIZATIONS: - Tdap: Tetanus vaccination status reviewed: last tetanus booster within 10 years. - HPV: Not applicable - Influenza: Up to date - Pneumovax: Not applicable - Prevnar 20: Not applicable - Shingrix (50+): Not applicable   Past medical history, surgical history, medications, allergies, family history and social history reviewed with patient today and changes made to appropriate areas of the chart.   Past Medical History:  Diagnosis Date   AMA (advanced maternal age) multigravida 35+ 06/30/2019   Anxiety    Asthma    occasional inhaler use   Back injury    Broken finger    H/O cone biopsy of cervix 03/27/2009   H/O sinus surgery    History of back surgery    History of vertebral fracture 05/05/2016   HPV (human papilloma virus) anogenital infection    IUGR (  intrauterine growth restriction) affecting care of mother 04/15/2019   LGSIL on Pap smear of cervix 04/14/2019   Preterm labor    PTL starting @ 28 wks   Short cervical length during pregnancy 04/14/2019   Threatened preterm labor 04/15/2019   Vaginal Pap smear, abnormal     Past Surgical History:  Procedure Laterality Date   BACK SURGERY     CERVICAL BIOPSY  W/ LOOP ELECTRODE EXCISION     FRACTURE SURGERY     fixation of fracture using dynamic compression system   NASAL SINUS SURGERY     multiple    Current Outpatient Medications on File Prior to Visit  Medication Sig   amphetamine-dextroamphetamine (ADDERALL) 10 MG tablet Take 1 tablet (10 mg total) by mouth daily with breakfast.   ibuprofen (ADVIL) 200 MG tablet Take 200 mg by mouth every 6 (six) hours as needed.   ipratropium (ATROVENT) 0.02 % nebulizer solution Take 2.5 mLs (0.5 mg total) by nebulization 4 (four) times daily.   loratadine (CLARITIN) 10 MG  tablet Take by mouth.   methocarbamol (ROBAXIN-750) 750 MG tablet Take 750 mg by mouth 4 (four) times daily.   Multiple Vitamin (MULTIVITAMIN PO) Take by mouth.   No current facility-administered medications on file prior to visit.    Allergies  Allergen Reactions   Sulfa Antibiotics Nausea And Vomiting and Other (See Comments)    Severe Stomach pain- Sulfa Abx only    Cetirizine Rash   Latex Swelling and Other (See Comments)    latex   Cauliflower [Brassica Oleracea] Itching    Itching of the mouth and throat   Sulfamethoxazole-Trimethoprim Other (See Comments)    Bactrim   Azithromycin Other (See Comments)    Stomach pains  azithromycin     Social History   Socioeconomic History   Marital status: Single    Spouse name: Not on file   Number of children: Not on file   Years of education: Not on file   Highest education level: Not on file  Occupational History   Occupation: human resources    Comment: [employer]  Tobacco Use   Smoking status: Never   Smokeless tobacco: Never  Vaping Use   Vaping status: Never Used  Substance and Sexual Activity   Alcohol use: Not Currently    Comment: socially   Drug use: Never   Sexual activity: Not Currently    Birth control/protection: I.U.D.  Other Topics Concern   Not on file  Social History Narrative   Not on file   Social Drivers of Health   Financial Resource Strain: Low Risk  (06/07/2023)   Overall Financial Resource Strain (CARDIA)    Difficulty of Paying Living Expenses: Not very hard  Food Insecurity: No Food Insecurity (06/07/2023)   Hunger Vital Sign    Worried About Running Out of Food in the Last Year: Never true    Ran Out of Food in the Last Year: Never true  Transportation Needs: No Transportation Needs (06/07/2023)   PRAPARE - Administrator, Civil Service (Medical): No    Lack of Transportation (Non-Medical): No  Physical Activity: Inactive (06/07/2023)   Exercise Vital Sign    Days of Exercise  per Week: 0 days    Minutes of Exercise per Session: 0 min  Stress: Stress Concern Present (06/07/2023)   [employer] of Occupational Health - Occupational Stress Questionnaire    Feeling of Stress : Rather much  Social Connections: Socially Isolated (06/07/2023)  Social Advertising account executive [NHANES]    Frequency of Communication with Friends and Family: More than three times a week    Frequency of Social Gatherings with Friends and Family: Once a week    Attends Religious Services: Never    Database administrator or Organizations: No    Attends Banker Meetings: Never    Marital Status: Never married  Intimate Partner Violence: Not At Risk (06/07/2023)   Humiliation, Afraid, Rape, and Kick questionnaire    Fear of Current or Ex-Partner: No    Emotionally Abused: No    Physically Abused: No    Sexually Abused: No   Social History   Tobacco Use  Smoking Status Never  Smokeless Tobacco Never   Social History   Substance and Sexual Activity  Alcohol Use Not Currently   Comment: socially    Family History  Problem Relation Age of Onset   Hyperlipidemia Mother    Depression Father    Anxiety disorder Father    Breast cancer Maternal Grandmother    Lung cancer Maternal Grandmother    Diabetes Paternal Grandfather      ROS: Denies fever, fatigue, unexplained weight loss/gain, chest pain, SHOB, and palpitations. Denies neurological deficits, gastrointestinal or genitourinary complaints, and skin changes.   Objective:   Today's Vitals   06/07/23 1031  BP: 115/70  Pulse: 78  SpO2: 100%  Weight: 157 lb (71.2 kg)  Height: 5\' 7"  (1.702 m)    GENERAL APPEARANCE: Well-appearing, in NAD. Well nourished.  SKIN: Pink, warm and dry. Turgor normal. No rash, lesion, ulceration, or ecchymoses. Hair evenly distributed.  HEENT: HEAD: Normocephalic.  EYES: PERRLA. EOMI. Lids intact w/o defect. Sclera white, Conjunctiva pink w/o exudate.  EARS: External  ear w/o redness, swelling, masses or lesions. EAC clear. TM's intact, translucent w/o bulging, appropriate landmarks visualized. Appropriate acuity to conversational tones.  NOSE: Septum midline w/o deformity. Nares patent, mucosa pink and non-inflamed w/o drainage. No sinus tenderness.  THROAT: Uvula midline. Oropharynx clear. Tonsils non-inflamed w/o exudate. Oral mucosa pink and moist.  NECK: Supple, Trachea midline. Full ROM w/o pain or tenderness. No lymphadenopathy. Thyroid non-tender w/ mild enlargement. No palpable masses.  RESPIRATORY: Chest wall symmetrical w/o masses. Respirations even and non-labored. Breath sounds clear to auscultation bilaterally. No wheezes, rales, rhonchi, or crackles. CARDIAC: S1, S2 present, regular rate and rhythm. No gallops, murmurs, rubs, or clicks. PMI w/o lifts, heaves, or thrills. No carotid bruits. Capillary refill <2 seconds. Peripheral pulses 2+ bilaterally. GI: Abdomen soft w/o distention. Normoactive bowel sounds. No palpable masses or tenderness. No guarding or rebound tenderness. Liver and spleen w/o tenderness or enlargement. No CVA tenderness.  MSK: Muscle tone and strength appropriate for age, w/o atrophy or abnormal movement.  EXTREMITIES: Active ROM intact, w/o tenderness, crepitus, or contracture. No obvious joint deformities or effusions. No clubbing, edema, or cyanosis.  NEUROLOGIC: CN's II-XII intact. Motor strength symmetrical with no obvious weakness. No sensory deficits. Steady, even gait.  PSYCH/MENTAL STATUS: Alert, oriented x 3. Cooperative, appropriate mood and affect.   Lab Results  Component Value Date   WBC 7.7 06/01/2023   HGB 15.0 06/01/2023   HCT 46.7 (H) 06/01/2023   MCV 92 06/01/2023   PLT 490 (H) 06/01/2023   CMP     Component Value Date/Time   NA 137 06/01/2023 0925   K 4.3 06/01/2023 0925   CL 102 06/01/2023 0925   CO2 24 06/01/2023 0925   GLUCOSE 85 06/01/2023 0925  BUN 5 (L) 06/01/2023 0925   CREATININE 0.85  06/01/2023 0925   CALCIUM 9.4 06/01/2023 0925   PROT 6.8 06/01/2023 0925   ALBUMIN 4.2 06/01/2023 0925   AST 13 06/01/2023 0925   ALT 10 06/01/2023 0925   ALKPHOS 101 06/01/2023 0925   BILITOT 0.3 06/01/2023 0925   EGFR 88 06/01/2023 0925   Lab Results  Component Value Date   TSH 1.480 06/01/2023   Lab Results  Component Value Date   CHOL 142 06/01/2023   HDL 51 06/01/2023   LDLCALC 76 06/01/2023   TRIG 78 06/01/2023   CHOLHDL 2.8 06/01/2023    Results for orders placed or performed in visit on 06/07/23  POCT Urine Drug Screen   Collection Time: 06/07/23 11:21 AM  Result Value Ref Range   POC Methamphetamine UR     POC Opiate Ur None Detected None Detected   POC Barbiturate UR     POC Amphetamine UR     POC Oxycodone UR None Detected None Detected   POC Cocaine UR None Detected None Detected   POC Ecstasy UR     POC TRICYCLICS UR     POC PHENCYCLIDINE UR     POC Marijuana UR None Detected None Detected   POC Methadone UR     POC BENZODIAZEPINES UR     URINE TEMPERATURE     POC DRUG SCREEN OXIDANTS URINE     POC SPECIFIC GRAVITY URINE     POC PH URINE     Methylenedioxyamphetamine     Morphine none detected     Assessment & Plan:  1. Annual physical exam (Primary) Discussed preventative screenings, vaccinations, and reviewed patient's lab with her in depth.  Discussed healthy lifestyle and regular exercise.  2. Attention deficit hyperactivity disorder (ADHD), combined type Well-controlled on current medication regimen of Adderall.  UDS and controlled substance agreement updated and completed in office.  Safe use use of medication reviewed with patient.  Return in 6 months for evaluation. - POCT Urine Drug Screen  3. Thrombocytosis Patient reports recent diet changes with increasing green leafy vegetables for the past 1 to 2 weeks.  Recommend patient stop this dietary change for least 1 to 2 weeks and repeat lab work including CBC, iron and B12 to rule out acute  causes of thrombocytosis.  4. Thyromegaly Mild thyromegaly on exam.  Patient is asymptomatic currently for thyroid disease.  Will obtain anti-TPO antibody, T3 and T4 with lab work today and if anti-TPO is negative, proceed with thyroid ultrasound. - Anti-TPO Ab (RDL) - T4, free - T3   Orders Placed This Encounter  Procedures   Anti-TPO Ab (RDL)   T4, free   T3   POCT Urine Drug Screen    PATIENT COUNSELING:  - Encouraged a healthy well-balanced diet. Patient may adjust caloric intake to maintain or achieve ideal body weight. May reduce intake of dietary saturated fat and total fat and have adequate dietary potassium and calcium preferably from fresh fruits, vegetables, and low-fat dairy products.   - Advised to avoid cigarette smoking. - Discussed with the patient that most people either abstain from alcohol or drink within safe limits (<=14/week and <=4 drinks/occasion for males, <=7/weeks and <= 3 drinks/occasion for females) and that the risk for alcohol disorders and other health effects rises proportionally with the number of drinks per week and how often a drinker exceeds daily limits. - Discussed cessation/primary prevention of drug use and availability of treatment for abuse.  -  Discussed sexually transmitted diseases, avoidance of unintended pregnancy and contraceptive alternatives.  - Stressed the importance of regular exercise - Injury prevention: Discussed safety belts, safety helmets, smoke detector, smoking near bedding or upholstery.  - Dental health: Discussed importance of regular tooth brushing, flossing, and dental visits.   NEXT PREVENTATIVE PHYSICAL DUE IN 1 YEAR.  Return for 2 appt; 2 weeks- Lab Visit Only; 6 month Office Visit ADHD.  Patient to reach out to office if new, worrisome, or unresolved symptoms arise or if no improvement in patient's condition. Patient verbalized understanding and is agreeable to treatment plan. All questions answered to patient's  satisfaction.    Hilbert Bible, [place]

## 2023-06-07 NOTE — Patient Instructions (Signed)
 Things to do to keep yourself healthy: - Exercise at least 30-45 minutes a day, 3-4 days a week.  - Eat a low-fat diet with lots of fruits and vegetables, up to 7-9 servings per day.  - Seatbelts can save your life. Wear them always.  - Smoke detectors on every level of your home, check batteries every year.  - Eye Doctor - have an eye exam every 1-2 years  - Safe sex - if you may be exposed to STDs, use a condom.  - No smoking, vaping, or use of any tobacco products.  - Alcohol -  If you drink, do it moderately, less than 2 drinks per day.  - No illegal drug use.  - Depression is common in our stressful world.If you're feeling down or losing interest in things you normally enjoy, please come in for a visit.  - Violence - If anyone is threatening or hurting you, please call immediately.   Pending your thyroid labs today, we will order the ultrasound if abnormal.

## 2023-06-08 ENCOUNTER — Other Ambulatory Visit (HOSPITAL_BASED_OUTPATIENT_CLINIC_OR_DEPARTMENT_OTHER): Payer: Self-pay | Admitting: Family Medicine

## 2023-06-08 DIAGNOSIS — F902 Attention-deficit hyperactivity disorder, combined type: Secondary | ICD-10-CM

## 2023-06-08 MED ORDER — AMPHETAMINE-DEXTROAMPHETAMINE 10 MG PO TABS
10.0000 mg | ORAL_TABLET | Freq: Every day | ORAL | 0 refills | Status: DC
Start: 2023-07-08 — End: 2023-08-09

## 2023-06-08 MED ORDER — AMPHETAMINE-DEXTROAMPHETAMINE 10 MG PO TABS: 10.0000 mg | ORAL_TABLET | Freq: Every day | ORAL | 0 refills | Status: DC

## 2023-06-15 LAB — T3: T3, Total: 105 ng/dL (ref 71–180)

## 2023-06-15 LAB — T4, FREE: Free T4: 1.04 ng/dL (ref 0.82–1.77)

## 2023-06-15 LAB — ANTI-TPO AB (RDL): Anti-TPO Ab (RDL): 129.5 [IU]/mL — ABNORMAL HIGH (ref <9.0)

## 2023-06-18 ENCOUNTER — Encounter (HOSPITAL_BASED_OUTPATIENT_CLINIC_OR_DEPARTMENT_OTHER): Payer: Self-pay | Admitting: Family Medicine

## 2023-06-18 ENCOUNTER — Other Ambulatory Visit (HOSPITAL_BASED_OUTPATIENT_CLINIC_OR_DEPARTMENT_OTHER): Payer: Self-pay | Admitting: Family Medicine

## 2023-06-18 DIAGNOSIS — R768 Other specified abnormal immunological findings in serum: Secondary | ICD-10-CM | POA: Insufficient documentation

## 2023-06-18 DIAGNOSIS — R7689 Other specified abnormal immunological findings in serum: Secondary | ICD-10-CM | POA: Insufficient documentation

## 2023-06-18 DIAGNOSIS — E010 Iodine-deficiency related diffuse (endemic) goiter: Secondary | ICD-10-CM

## 2023-06-18 NOTE — Progress Notes (Signed)
 Hi Tammie Frederick,  Your T4 and T3 were normal (thyroid hormones) but your anti-TPO (thyroid autoimmune antibodies) were elevated significantly. This can indicate the presence of Hashimoto's Thyroiditis or thyroid disease. Some patients can remain without symptoms of Hashimotos for several years. This is likely causing inflammation of the thyroid gland. I have placed an order for an Ultrasound of your Thyroid given this lab result and placed a referral to Endocrinology to get a further evaluation pending ultrasound results. Patients may or may not be treated with medication based upon the [place] and level of antibodies. If you have further questions, please let me know.

## 2023-06-28 ENCOUNTER — Ambulatory Visit (HOSPITAL_BASED_OUTPATIENT_CLINIC_OR_DEPARTMENT_OTHER)
Admission: RE | Admit: 2023-06-28 | Discharge: 2023-06-28 | Disposition: A | Discharge status: Home / Self Care | Source: Ambulatory Visit | Attending: Family Medicine | Admitting: Family Medicine

## 2023-06-28 DIAGNOSIS — R768 Other specified abnormal immunological findings in serum: Secondary | ICD-10-CM | POA: Insufficient documentation

## 2023-06-28 DIAGNOSIS — R7689 Other specified abnormal immunological findings in serum: Secondary | ICD-10-CM

## 2023-06-28 DIAGNOSIS — E010 Iodine-deficiency related diffuse (endemic) goiter: Secondary | ICD-10-CM | POA: Diagnosis present

## 2023-07-11 ENCOUNTER — Encounter (HOSPITAL_BASED_OUTPATIENT_CLINIC_OR_DEPARTMENT_OTHER): Payer: Self-pay | Admitting: Family Medicine

## 2023-07-12 NOTE — Telephone Encounter (Signed)
 Pt sent mychart message checking on the results of recent thyroid ultrasound and to see what next steps were. Alexis, please advise.

## 2023-07-15 ENCOUNTER — Encounter (HOSPITAL_BASED_OUTPATIENT_CLINIC_OR_DEPARTMENT_OTHER): Payer: Self-pay | Admitting: Family Medicine

## 2023-07-15 NOTE — Progress Notes (Signed)
 Hi Taesha,  Sorry for the delay in your result as I was out of the office and your result went to my in basket while I was gone. Your thyroid  ultrasound showed 2 nodules which were very small and did not require a biopsy based upon their appearance. Based upon your labs, you are presenting with Hashimoto's Thyroiditis. Even with a normal TSH level currently, this can change in the future in regards to your thyroid  function. I did place a referral on 3/24 to Endocrinology to establish care and manage Hashimoto's. If you would like further information or have questions, please let me know. If you have not heard from Endocrinology, please let us  know.

## 2023-07-17 ENCOUNTER — Other Ambulatory Visit (HOSPITAL_BASED_OUTPATIENT_CLINIC_OR_DEPARTMENT_OTHER): Payer: Self-pay | Admitting: Family Medicine

## 2023-07-17 DIAGNOSIS — F902 Attention-deficit hyperactivity disorder, combined type: Secondary | ICD-10-CM

## 2023-08-08 ENCOUNTER — Encounter (HOSPITAL_BASED_OUTPATIENT_CLINIC_OR_DEPARTMENT_OTHER): Payer: Self-pay | Admitting: Family Medicine

## 2023-08-09 ENCOUNTER — Other Ambulatory Visit (HOSPITAL_BASED_OUTPATIENT_CLINIC_OR_DEPARTMENT_OTHER): Payer: Self-pay | Admitting: Family Medicine

## 2023-08-09 ENCOUNTER — Other Ambulatory Visit (HOSPITAL_BASED_OUTPATIENT_CLINIC_OR_DEPARTMENT_OTHER): Payer: Self-pay

## 2023-08-09 DIAGNOSIS — F902 Attention-deficit hyperactivity disorder, combined type: Secondary | ICD-10-CM

## 2023-08-09 MED ORDER — AMPHETAMINE-DEXTROAMPHETAMINE 10 MG PO TABS
10.0000 mg | ORAL_TABLET | Freq: Every day | ORAL | 0 refills | Status: DC
  Filled 2024-01-12: qty 30, 30d supply, fill #0

## 2023-08-09 MED ORDER — AMPHETAMINE-DEXTROAMPHETAMINE 10 MG PO TABS: 10.0000 mg | ORAL_TABLET | Freq: Every day | ORAL | 0 refills | Status: DC

## 2023-08-09 MED ORDER — AMPHETAMINE-DEXTROAMPHETAMINE 10 MG PO TABS
10.0000 mg | ORAL_TABLET | Freq: Every day | ORAL | 0 refills | Status: DC
  Filled 2023-08-09: qty 30, 30d supply, fill #0

## 2023-08-09 NOTE — Telephone Encounter (Signed)
 Please see mychart message sent by pt and advise.

## 2023-09-27 ENCOUNTER — Encounter: Payer: Self-pay | Admitting: "Endocrinology

## 2023-09-27 ENCOUNTER — Ambulatory Visit: Admitting: "Endocrinology

## 2023-09-27 VITALS — BP 110/70 | HR 70 | Ht 67.0 in | Wt 158.0 lb

## 2023-09-27 DIAGNOSIS — R768 Other specified abnormal immunological findings in serum: Secondary | ICD-10-CM | POA: Diagnosis not present

## 2023-09-27 DIAGNOSIS — E042 Nontoxic multinodular goiter: Secondary | ICD-10-CM | POA: Diagnosis not present

## 2023-09-27 NOTE — Progress Notes (Signed)
 Outpatient Endocrinology Note Obadiah Birmingham, MD  09/27/23   Tammie Frederick January 19, 1981 [id number]  Referring Provider: Knute Thersia Bitters, * Primary Care Provider: Knute Thersia Bitters, FNP Subjective  No chief complaint on file.  Assessment & Plan  Diagnoses and all orders for this visit:  Anti-TPO antibodies present -     Cancel: TSH + free T4 -     TSH + free T4  Multiple thyroid  nodules   Tammie Frederick is currently not taking any thyroid  medication. Never been on it. Grandma had thyroid  issues. Patient is currently biochemically euthyroid. TPO Ab +. TSH, FT4, T3 WNL.  Educated on thyroid  axis.  Recommend the following: annual TSH reflex to FT4.  Repeat lab before next visit or sooner if symptoms of hyperthyroidism or hypothyroidism develop.  Notify us  immediately in case of pregnancy/breastfeeding or significant weight gain or loss. Counseled on compliance and follow up needs.  06/2023 thyroid  U/S images and report reviewed: reported mildly heterogeneous thyroid  gland. Right thyroid  nodules do not meet criteria for biopsy or dedicated follow-up. Nodule # 1: TR3 Right; Superior, 1.4 cm x 1.0 x 1.0 cm, Nodule # 2: TR3 Right; Superior 0.7 cm x 0.5 x 0.4 cm Recommend f/u in 1 year  I have reviewed current medications, nurse's notes, allergies, vital signs, past medical and surgical history, family medical history, and social history for this encounter. Counseled patient on symptoms, examination findings, lab findings, imaging results, treatment decisions and monitoring and prognosis. The patient understood the recommendations and agrees with the treatment plan. All questions regarding treatment plan were fully answered.   Return in about 1 year (around 09/26/2024) for visit + labs before next visit.   Obadiah Birmingham, MD  09/27/23   I have reviewed current medications, nurse's notes, allergies, vital signs, past medical and surgical history, family medical history,  and social history for this encounter. Counseled patient on symptoms, examination findings, lab findings, imaging results, treatment decisions and monitoring and prognosis. The patient understood the recommendations and agrees with the treatment plan. All questions regarding treatment plan were fully answered.   History of Present Illness Tammie Frederick is a 43 y.o. year old female who presents to our clinic with TPO Ab + diagnosed in 05/2023.    Symptoms suggestive of HYPOTHYROIDISM:  fatigue Yes weight gain No cold intolerance  Yes constipation  No  Symptoms suggestive of HYPERTHYROIDISM:  weight loss  No heat intolerance No hyperdefecation  No palpitations  No  Compressive symptoms:  dysphagia  No dysphonia  No positional dyspnea (especially with simultaneous arms elevation)  No  Smokes  No On biotin  No Personal history of head/neck surgery/irradiation  Yes s/p sinus surgery   Physical Exam  BP 110/70   Pulse 70   Ht 5' 7 (1.702 m)   Wt 158 lb (71.7 kg)   SpO2 98%   BMI 24.75 kg/m  Constitutional: well developed, well nourished Head: normocephalic, atraumatic, no exophthalmos Eyes: sclera anicteric, no redness Neck: no thyromegaly, no thyroid  tenderness; no nodules palpated Lungs: normal respiratory effort Neurology: alert and oriented, no fine hand tremor Skin: dry, no appreciable rashes Musculoskeletal: no appreciable defects Psychiatric: normal mood and affect  Allergies Allergies  Allergen Reactions   Sulfa Antibiotics Nausea And Vomiting and Other (See Comments)    Severe Stomach pain- Sulfa Abx only    Cetirizine Rash   Latex Swelling and Other (See Comments)    latex   Cauliflower [Brassica Oleracea] Itching    Itching  of the mouth and throat   Sulfamethoxazole-Trimethoprim Other (See Comments)    Bactrim   Azithromycin  Other (See Comments)    Stomach pains  azithromycin     Current Medications Patient's Medications  New Prescriptions   No  medications on file  Previous Medications   AMPHETAMINE -DEXTROAMPHETAMINE  (ADDERALL) 10 MG TABLET    Take 1 tablet (10 mg total) by mouth daily with breakfast.   AMPHETAMINE -DEXTROAMPHETAMINE  (ADDERALL) 10 MG TABLET    Take 1 tablet (10 mg total) by mouth daily with breakfast.   AMPHETAMINE -DEXTROAMPHETAMINE  (ADDERALL) 10 MG TABLET    Take 1 tablet (10 mg total) by mouth daily with breakfast.   IBUPROFEN  (ADVIL ) 200 MG TABLET    Take 200 mg by mouth every 6 (six) hours as needed.   IPRATROPIUM (ATROVENT ) 0.02 % NEBULIZER SOLUTION    Take 2.5 mLs (0.5 mg total) by nebulization 4 (four) times daily.   LORATADINE  (CLARITIN ) 10 MG TABLET    Take by mouth.   METHOCARBAMOL  (ROBAXIN -750) 750 MG TABLET    Take 750 mg by mouth 4 (four) times daily.   MULTIPLE VITAMIN (MULTIVITAMIN PO)    Take by mouth.  Modified Medications   No medications on file  Discontinued Medications   No medications on file    Past Medical History Past Medical History:  Diagnosis Date   AMA (advanced maternal age) multigravida 35+ 06/30/2019   Anxiety    Asthma    occasional inhaler use   Back injury    Broken finger    H/O cone biopsy of cervix 03/27/2009   H/O sinus surgery    History of back surgery    History of vertebral fracture 05/05/2016   HPV (human papilloma virus) anogenital infection    IUGR (intrauterine growth restriction) affecting care of mother 04/15/2019   LGSIL on Pap smear of cervix 04/14/2019   Preterm labor    PTL starting @ 28 wks   Short cervical length during pregnancy 04/14/2019   Threatened preterm labor 04/15/2019   Vaginal Pap smear, abnormal     Past Surgical History Past Surgical History:  Procedure Laterality Date   BACK SURGERY     CERVICAL BIOPSY  W/ LOOP ELECTRODE EXCISION     FRACTURE SURGERY     fixation of fracture using dynamic compression system   NASAL SINUS SURGERY     multiple    Family History family history includes Anxiety disorder in her father; Breast  cancer in her maternal grandmother; Depression in her father; Diabetes in her paternal grandfather; Hyperlipidemia in her mother; Lung cancer in her maternal grandmother.  Social History Social History   Socioeconomic History   Marital status: Single    Spouse name: Not on file   Number of children: Not on file   Years of education: Not on file   Highest education level: Not on file  Occupational History   Occupation: human resources    Comment: [employer]  Tobacco Use   Smoking status: Never   Smokeless tobacco: Never  Vaping Use   Vaping status: Never Used  Substance and Sexual Activity   Alcohol use: Not Currently    Comment: socially   Drug use: Never   Sexual activity: Not Currently    Birth control/protection: I.U.D.  Other Topics Concern   Not on file  Social History Narrative   Not on file   Social Drivers of Health   Financial Resource Strain: Low Risk  (06/07/2023)   Overall Financial Resource Strain (CARDIA)  Difficulty of Paying Living Expenses: Not very hard  Food Insecurity: No Food Insecurity (06/07/2023)   Hunger Vital Sign    Worried About Running Out of Food in the Last Year: Never true    Ran Out of Food in the Last Year: Never true  Transportation Needs: No Transportation Needs (06/07/2023)   PRAPARE - Administrator, Civil Service (Medical): No    Lack of Transportation (Non-Medical): No  Physical Activity: Inactive (06/07/2023)   Exercise Vital Sign    Days of Exercise per Week: 0 days    Minutes of Exercise per Session: 0 min  Stress: Stress Concern Present (06/07/2023)   [employer] of Occupational Health - Occupational Stress Questionnaire    Feeling of Stress : Rather much  Social Connections: Socially Isolated (06/07/2023)   Social Connection and Isolation Panel    Frequency of Communication with Friends and Family: More than three times a week    Frequency of Social Gatherings with Friends and Family: Once a week    Attends  Religious Services: Never    Database administrator or Organizations: No    Attends Banker Meetings: Never    Marital Status: Never married  Intimate Partner Violence: Not At Risk (06/07/2023)   Humiliation, Afraid, Rape, and Kick questionnaire    Fear of Current or Ex-Partner: No    Emotionally Abused: No    Physically Abused: No    Sexually Abused: No    Laboratory Investigations Lab Results  Component Value Date   TSH 1.480 06/01/2023   FREET4 1.04 06/07/2023     No results found for: TSI   No components found for: TRAB   Lab Results  Component Value Date   CHOL 142 06/01/2023   Lab Results  Component Value Date   HDL 51 06/01/2023   Lab Results  Component Value Date   LDLCALC 76 06/01/2023   Lab Results  Component Value Date   TRIG 78 06/01/2023   Lab Results  Component Value Date   CHOLHDL 2.8 06/01/2023   Lab Results  Component Value Date   CREATININE 0.85 06/01/2023   No results found for: GFR    Component Value Date/Time   NA 137 06/01/2023 0925   K 4.3 06/01/2023 0925   CL 102 06/01/2023 0925   CO2 24 06/01/2023 0925   GLUCOSE 85 06/01/2023 0925   BUN 5 (L) 06/01/2023 0925   CREATININE 0.85 06/01/2023 0925   CALCIUM  9.4 06/01/2023 0925   PROT 6.8 06/01/2023 0925   ALBUMIN 4.2 06/01/2023 0925   AST 13 06/01/2023 0925   ALT 10 06/01/2023 0925   ALKPHOS 101 06/01/2023 0925   BILITOT 0.3 06/01/2023 0925      Latest Ref Rng & Units 06/01/2023    9:25 AM 07/27/2022   11:33 AM  BMP  Glucose 70 - 99 mg/dL 85  88   BUN 6 - 24 mg/dL 5  8   Creatinine 9.42 - 1.00 mg/dL 9.14  9.09   BUN/Creat Ratio 9 - 23 6  9    Sodium 134 - 144 mmol/L 137  141   Potassium 3.5 - 5.2 mmol/L 4.3  4.7   Chloride 96 - 106 mmol/L 102  104   CO2 20 - 29 mmol/L 24  22   Calcium  8.7 - 10.2 mg/dL 9.4  9.4        Component Value Date/Time   WBC 7.7 06/01/2023 0925   WBC 17.0 (H) 07/02/2019 9386  RBC 5.10 06/01/2023 0925   RBC 4.31 07/02/2019 0613    HGB 15.0 06/01/2023 0925   HCT 46.7 (H) 06/01/2023 0925   PLT 490 (H) 06/01/2023 0925   MCV 92 06/01/2023 0925   MCH 29.4 06/01/2023 0925   MCH 28.5 07/02/2019 0613   MCHC 32.1 06/01/2023 0925   MCHC 31.5 07/02/2019 0613   RDW 12.2 06/01/2023 0925   LYMPHSABS 2.7 06/01/2023 0925   MONOABS 0.8 05/12/2019 1104   EOSABS 0.2 06/01/2023 0925   BASOSABS 0.1 06/01/2023 0925      Parts of this note may have been dictated using voice recognition software. There may be variances in spelling and vocabulary which are unintentional. Not all errors are proofread. Please notify the dino if any discrepancies are noted or if the meaning of any statement is not clear.

## 2023-10-16 ENCOUNTER — Encounter (HOSPITAL_BASED_OUTPATIENT_CLINIC_OR_DEPARTMENT_OTHER): Payer: Self-pay

## 2023-12-12 ENCOUNTER — Other Ambulatory Visit (HOSPITAL_BASED_OUTPATIENT_CLINIC_OR_DEPARTMENT_OTHER): Payer: Self-pay

## 2023-12-12 ENCOUNTER — Other Ambulatory Visit: Payer: Self-pay

## 2023-12-12 ENCOUNTER — Encounter (HOSPITAL_BASED_OUTPATIENT_CLINIC_OR_DEPARTMENT_OTHER): Payer: Self-pay | Admitting: Family Medicine

## 2023-12-12 ENCOUNTER — Ambulatory Visit (HOSPITAL_BASED_OUTPATIENT_CLINIC_OR_DEPARTMENT_OTHER): Admitting: Family Medicine

## 2023-12-12 VITALS — BP 111/77 | HR 72 | Ht 67.0 in | Wt 168.0 lb

## 2023-12-12 DIAGNOSIS — R5382 Chronic fatigue, unspecified: Secondary | ICD-10-CM | POA: Diagnosis not present

## 2023-12-12 DIAGNOSIS — R635 Abnormal weight gain: Secondary | ICD-10-CM

## 2023-12-12 DIAGNOSIS — F902 Attention-deficit hyperactivity disorder, combined type: Secondary | ICD-10-CM | POA: Diagnosis not present

## 2023-12-12 DIAGNOSIS — E063 Autoimmune thyroiditis: Secondary | ICD-10-CM | POA: Insufficient documentation

## 2023-12-12 DIAGNOSIS — L249 Irritant contact dermatitis, unspecified cause: Secondary | ICD-10-CM

## 2023-12-12 DIAGNOSIS — Z1231 Encounter for screening mammogram for malignant neoplasm of breast: Secondary | ICD-10-CM

## 2023-12-12 LAB — POCT URINE DRUG SCREEN
Morphine: NOT DETECTED
POC BENZODIAZEPINES UR: NOT DETECTED
POC Cocaine UR: NOT DETECTED
POC Marijuana UR: NOT DETECTED
POC Oxycodone UR: NOT DETECTED

## 2023-12-12 MED ORDER — BETAMETHASONE DIPROPIONATE 0.05 % EX CREA
1.0000 | TOPICAL_CREAM | Freq: Two times a day (BID) | CUTANEOUS | 0 refills | Status: AC
  Filled 2023-12-12: qty 30, 15d supply, fill #0
  Filled 2023-12-12: qty 30, 30d supply, fill #0

## 2023-12-12 NOTE — Progress Notes (Signed)
 Subjective:   Tammie Frederick Mar 29, 1980 12/12/2023  Chief Complaint  Patient presents with   Medical Management of Chronic Issues    Pt wants to discuss diagnosis of Hashimoto's that she states her endocrinologist did not seem concerned about. Also has a rash she wants to have looked at and wants to discuss weight loss management.    Discussed the use of AI scribe software for clinical note transcription with the patient, who gave verbal consent to proceed.  History of Present Illness Tammie Frederick is a 43 year old female with ADHD and Hashimoto's thyroiditis who presents for follow-up on medication management and thyroid  concerns.  ADHD:  She is following up on her ADHD management with Adderall. She has not experienced any adverse side effects such as decreased appetite, headaches, or tics. She has been taking Adderall 10 mg, but has not been using it regularly due to recent unemployment. With a new job secured, she plans to resume daily use. She does not require a refill at this time as her last prescription was filled in May. PDMP reviewed by PCP.   HASHIMOTO'S:  She expresses concerns about her thyroid  condition, specifically Hashimoto's thyroiditis. She reports significant fatigue, weight gain, and a rash that has persisted for a couple of weeks. She also mentions experiencing a pain under her rib cage that is intermittent and severe enough to 'take her breath away'. Additionally, she notes a change in her voice, describing it as getting lower. Despite these symptoms, she feels dismissed by her endocrinologist.  WEIGHT CONCERN:  She has gained a significant amount of weight over the past few months and is currently at her heaviest non-pregnant weight. She is concerned about her BMI, which is currently 26, and the impact of her weight on her energy levels and overall well-being. She mentions a strong craving for sugar, which she attributes to her ADHD medication, and describes a  history of consuming large amounts of soda when not on her medication. She is also concerned about potential perimenopausal symptoms, including fatigue and mood changes, and is interested in checking her hormone levels. She has not had her vitamin D, B12, or iron levels checked recently and is interested in having these evaluated.  Wt Readings from Last 3 Encounters:  12/12/23 168 lb (76.2 kg)  09/27/23 158 lb (71.7 kg)  06/07/23 157 lb (71.2 kg)    RASH:  She has a history of contact dermatitis and is currently experiencing a rash to her anterior neck that does not itch or hurt but is persistent despite the use of cortisone and triamcinolone creams. She suspects it may be related to a previous allergy to sunscreen.     The following portions of the patient's history were reviewed and updated as appropriate: past medical history, past surgical history, family history, social history, allergies, medications, and problem list.   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   Thrombocytosis 06/07/2023   RUQ abdominal pain 07/27/2022   Pre-menstrual mood disorder 12/24/2021   Attention deficit hyperactivity disorder (ADHD), combined type 12/24/2021   Arthrodesis status 12/28/2020   Bulging of cervical intervertebral disc 12/28/2020   Bulging of lumbar intervertebral disc 12/28/2020   GAD (generalized anxiety disorder) 12/28/2020   Dizziness 12/28/2020   Cervical disc disorder with radiculopathy 12/27/2020   Thyroid  nodule 05/05/2016   Chronic back pain 12/17/2015   Past Medical History:  Diagnosis Date   Allergy    AMA (advanced maternal age) multigravida 35+ 06/30/2019   Anemia    Anxiety    Asthma    occasional inhaler use   Back injury    Broken finger    H/O cone biopsy of cervix 03/27/2009   H/O sinus surgery    History of back surgery    History of vertebral  fracture 05/05/2016   HPV (human papilloma virus) anogenital infection    IUGR (intrauterine growth restriction) affecting care of mother 04/15/2019   LGSIL on Pap smear of cervix 04/14/2019   Preterm labor    PTL starting @ 28 wks   Short cervical length during pregnancy 04/14/2019   Threatened preterm labor 04/15/2019   Vaginal Pap smear, abnormal    Past Surgical History:  Procedure Laterality Date   BACK SURGERY     CERVICAL BIOPSY  W/ LOOP ELECTRODE EXCISION     FRACTURE SURGERY     fixation of fracture using dynamic compression system   NASAL SINUS SURGERY     multiple   SPINE SURGERY  09/06/2010   L1 compression fracture   Family History  Problem Relation Age of Onset   Hyperlipidemia Mother    Depression Father    Anxiety disorder Father    Alcohol abuse Father    Drug abuse Father    Breast cancer Maternal Grandmother    Lung cancer Maternal Grandmother    Cancer Maternal Grandmother    Diabetes Paternal Grandfather    Outpatient Medications Prior to Visit  Medication Sig Dispense Refill   amphetamine -dextroamphetamine  (ADDERALL) 10 MG tablet Take 1 tablet (10 mg total) by mouth daily with breakfast. 30 tablet 0   ibuprofen  (ADVIL ) 200 MG tablet Take 200 mg by mouth every 6 (six) hours as needed.     ipratropium (ATROVENT ) 0.02 % nebulizer solution Take 2.5 mLs (0.5 mg total) by nebulization 4 (four) times daily. 75 mL 3   loratadine  (CLARITIN ) 10 MG tablet Take by mouth.     methocarbamol  (ROBAXIN -750) 750 MG tablet Take 750 mg by mouth 4 (four) times daily.     Multiple Vitamin (MULTIVITAMIN PO) Take by mouth.     amphetamine -dextroamphetamine  (ADDERALL) 10 MG tablet Take 1 tablet (10 mg total) by mouth daily with breakfast. 30 tablet 0   amphetamine -dextroamphetamine  (ADDERALL) 10 MG tablet Take 1 tablet (10 mg total) by mouth daily with breakfast. 30 tablet 0   No facility-administered medications prior to visit.   Allergies  Allergen Reactions   Sulfa  Antibiotics Nausea And Vomiting and Other (See Comments)    Severe Stomach pain- Sulfa Abx only    Cetirizine Rash   Latex Swelling and Other (See Comments)    latex   Cauliflower [Brassica Oleracea] Itching    Itching of the mouth and throat   Sulfamethoxazole-Trimethoprim Other (See Comments)    Bactrim   Azithromycin  Other (See Comments)    Stomach pains  azithromycin      ROS: A complete ROS was performed with pertinent positives/negatives noted in the HPI. The remainder of the ROS are negative.    Objective:   Today's Vitals   12/12/23 0912  BP: 111/77  Pulse: 72  SpO2: 97%  Weight: 168 lb (76.2 kg)  Height: 5' 7 (1.702 m)    Physical Exam   GENERAL: Well-appearing, in NAD. Well nourished.  SKIN: Pink, warm and dry. Macular red rash present to anterior neck without drainage or flaking. Head: Normocephalic. NECK: Trachea midline.  Full ROM w/o pain or tenderness.  RESPIRATORY: Chest wall symmetrical. Respirations even and non-labored. Breath sounds clear to auscultation bilaterally.  CARDIAC: S1, S2 present, regular rate and rhythm without murmur or gallops. Peripheral pulses 2+ bilaterally.  MSK: Muscle tone and strength appropriate for age.   NEUROLOGIC: No motor or sensory deficits. Steady, even gait. C2-C12 intact.  PSYCH/MENTAL STATUS: Alert, oriented x 3. Cooperative, appropriate mood and affect.   Health Maintenance Due  Topic Date Due   COVID-19 Vaccine (1) Never done   Influenza Vaccine  10/26/2023   Mammogram  12/16/2023    Results for orders placed or performed in visit on 12/12/23  POCT Urine Drug Screen  Result Value Ref Range   POC Methamphetamine UR     POC Opiate Ur     POC Barbiturate UR     POC Amphetamine  UR     POC Oxycodone  UR None Detected None Detected   POC Cocaine UR None Detected None Detected   POC Ecstasy UR     POC TRICYCLICS UR     POC PHENCYCLIDINE UR     POC Marijuana UR None Detected None Detected   POC Methadone UR      POC BENZODIAZEPINES UR None Detected None Detected   URINE TEMPERATURE     POC DRUG SCREEN OXIDANTS URINE     POC SPECIFIC GRAVITY URINE     POC PH URINE     Methylenedioxyamphetamine       Morphine none detected     The 10-year ASCVD risk score (Arnett DK, et al., 2019) is: 0.3%     Assessment & Plan:   1. Attention deficit hyperactivity disorder (ADHD), combined type (Primary) UDS negative in office.  Patient will resume Adderall 10 mg daily with starting new job.  She will reach out to PCP when needing refill.  CSA up-to-date. - POCT Urine Drug Screen  2. Hashimoto's disease Discussed that patient is in a euthyroid state and does not require levothyroxine supplementation at this time.  Will check TSH with lab work and plan to continue monitoring TSH levels frequently.  Patient verbalized understanding.  She does have a follow-up with endocrinology in 1 year. - TSH  3. Weight gain Discussed multifactorial contributions with weight gain.  Will check A1c and free insulin level for possible insulin resistance as well as hormones per patient request due to perimenopausal symptoms.  Discussed options such as healthy weight and wellness as patient's BMI does not show a clear indication for GLP-1 medications.  She will consider and reach out to PCP if she would like referral pending lab results. - Hemoglobin A1c - FSH/LH - Estradiol - Insulin, Free and Total  4. Chronic fatigue Will check vitamin D, B12 and iron with lab work today given patient's chronic fatigue. - VITAMIN D 25 Hydroxy (Vit-D Deficiency, Fractures) - Vitamin B12 - Iron, TIBC and Ferritin Panel  5. Breast cancer screening by mammogram - MM 3D SCREENING MAMMOGRAM BILATERAL BREAST; Future  6. Irritant contact dermatitis, unspecified trigger Start betamethasone  0.05% cream twice daily for the next 1 to 2 weeks to affected rash.  If no improvement or worsening, reach out to PCP within 1 to 2 weeks.    Meds  ordered this encounter  Medications   betamethasone  dipropionate 0.05 % cream    Sig: Apply 1 Application topically 2 (two) times daily.    Dispense:  30 g    Refill:  0    Supervising Provider:   DE  PERU, RAYMOND J [[id number]]   Lab Orders         TSH         Hemoglobin A1c         FSH/LH         Estradiol         Insulin, Free and Total         VITAMIN D 25 Hydroxy (Vit-D Deficiency, Fractures)         Vitamin B12         Iron, TIBC and Ferritin Panel         POCT Urine Drug Screen     No images are attached to the encounter or orders placed in the encounter.  Return in about 6 months (around 06/10/2024) for ANNUAL PHYSICAL, Thyroid  Check, ADHD.    Patient to reach out to office if new, worrisome, or unresolved symptoms arise or if no improvement in patient's condition. Patient verbalized understanding and is agreeable to treatment plan. All questions answered to patient's satisfaction.    Tammie Frederick, [place]

## 2023-12-13 ENCOUNTER — Other Ambulatory Visit (HOSPITAL_BASED_OUTPATIENT_CLINIC_OR_DEPARTMENT_OTHER): Payer: Self-pay

## 2023-12-14 ENCOUNTER — Ambulatory Visit (HOSPITAL_BASED_OUTPATIENT_CLINIC_OR_DEPARTMENT_OTHER): Admitting: Family Medicine

## 2023-12-21 ENCOUNTER — Ambulatory Visit (HOSPITAL_BASED_OUTPATIENT_CLINIC_OR_DEPARTMENT_OTHER): Admitting: Family Medicine

## 2023-12-23 LAB — FSH/LH
FSH: 13.5 m[IU]/mL
LH: 23.5 m[IU]/mL

## 2023-12-23 LAB — IRON,TIBC AND FERRITIN PANEL
Ferritin: 48 ng/mL (ref 15–150)
Iron Saturation: 17 % (ref 15–55)
Iron: 56 ug/dL (ref 27–159)
Total Iron Binding Capacity: 330 ug/dL (ref 250–450)
UIBC: 274 ug/dL (ref 131–425)

## 2023-12-23 LAB — HEMOGLOBIN A1C
Est. average glucose Bld gHb Est-mCnc: 103 mg/dL
Hgb A1c MFr Bld: 5.2 % (ref 4.8–5.6)

## 2023-12-23 LAB — VITAMIN D 25 HYDROXY (VIT D DEFICIENCY, FRACTURES): Vit D, 25-Hydroxy: 31.3 ng/mL (ref 30.0–100.0)

## 2023-12-23 LAB — INSULIN, FREE AND TOTAL
Free Insulin: 4.3 uU/mL
Total Insulin: 4.3 uU/mL

## 2023-12-23 LAB — TSH: TSH: 1.57 u[IU]/mL (ref 0.450–4.500)

## 2023-12-23 LAB — ESTRADIOL: Estradiol: 201 pg/mL

## 2023-12-23 LAB — VITAMIN B12: Vitamin B-12: 256 pg/mL (ref 232–1245)

## 2023-12-24 ENCOUNTER — Ambulatory Visit (HOSPITAL_BASED_OUTPATIENT_CLINIC_OR_DEPARTMENT_OTHER): Payer: Self-pay | Admitting: Family Medicine

## 2023-12-24 NOTE — Progress Notes (Signed)
 Hi Tammie Frederick,  Your labs show a stable thyroid  function and no signs of contributing prediabetes or insulin resistance. Your hormones are within normal range. Your Vitamin D and B12 are on the low end of normal and can be improved with an over the counter oral supplement. Iron panels are negative for signs of anemia.

## 2023-12-28 ENCOUNTER — Ambulatory Visit (HOSPITAL_BASED_OUTPATIENT_CLINIC_OR_DEPARTMENT_OTHER): Admitting: Radiology

## 2024-01-13 ENCOUNTER — Other Ambulatory Visit (HOSPITAL_BASED_OUTPATIENT_CLINIC_OR_DEPARTMENT_OTHER): Payer: Self-pay

## 2024-01-14 ENCOUNTER — Other Ambulatory Visit (HOSPITAL_BASED_OUTPATIENT_CLINIC_OR_DEPARTMENT_OTHER): Payer: Self-pay

## 2024-01-16 ENCOUNTER — Other Ambulatory Visit (HOSPITAL_BASED_OUTPATIENT_CLINIC_OR_DEPARTMENT_OTHER): Payer: Self-pay

## 2024-02-14 ENCOUNTER — Ambulatory Visit: Payer: Self-pay

## 2024-02-14 NOTE — Telephone Encounter (Signed)
 FYI Only or Action Required?: FYI only for provider: appointment scheduled on 02/15/2024.  Patient was last seen in primary care on 12/12/2023 by Knute Thersia Bitters, FNP.  Called Nurse Triage reporting Otalgia and Sore Throat.  Symptoms began several weeks ago.  Interventions attempted: Prescription medications: flonase , augmentin .  Symptoms are: gradually worsening.  Triage Disposition: See PCP When Office is Open (Within 3 Days)  Patient/caregiver understands and will follow disposition?: Yes   Summary: Ear and throat discomfort, no appt   Reason for Triage: Pt has ear and throat discomfort, pain and itching. no appt soon enough, please advise  Best contact: [phone]         Reason for Disposition  [1] Sinus congestion (pressure, fullness) AND [2] present > 10 days  Answer Assessment - Initial Assessment Questions Has been on augmentin  and flonase , some improvement, but has worsened over the past 3-4 days  1. LOCATION: Where does it hurt?      Right side of sinuses, ear, sore throat, eye, head pain 2. ONSET: When did the sinus pain start?  (e.g., hours, days)      Two and one half weeks ago 3. SEVERITY: How bad is the pain?   (Scale 0-10; or none, mild, moderate or severe)     moderate 4. RECURRENT SYMPTOM: Have you ever had sinus problems before? If Yes, ask: When was the last time? and What happened that time?      Yes, recurrent sinus infections 5. NASAL CONGESTION: Is the nose blocked? If Yes, ask: Can you open it or must you breathe through your mouth?    Intermittent congestion 6. NASAL DISCHARGE: Do you have discharge from your nose? If so ask, What color?     yellow 7. FEVER: Do you have a fever? If Yes, ask: What is it, how was it measured, and when did it start?      denies 8. OTHER SYMPTOMS: Do you have any other symptoms? (e.g., sore throat, cough, earache, difficulty breathing)     Sore throat, right ear pain, right itchy ear,  sour taste in mouth  Protocols used: Sinus Pain or Congestion-A-AH

## 2024-02-15 ENCOUNTER — Ambulatory Visit (HOSPITAL_BASED_OUTPATIENT_CLINIC_OR_DEPARTMENT_OTHER): Admitting: Family Medicine

## 2024-02-15 ENCOUNTER — Encounter (HOSPITAL_BASED_OUTPATIENT_CLINIC_OR_DEPARTMENT_OTHER): Payer: Self-pay | Admitting: Family Medicine

## 2024-02-15 ENCOUNTER — Other Ambulatory Visit (HOSPITAL_BASED_OUTPATIENT_CLINIC_OR_DEPARTMENT_OTHER): Payer: Self-pay

## 2024-02-15 VITALS — BP 107/74 | HR 85 | Temp 97.9°F | Ht 67.0 in | Wt 163.0 lb

## 2024-02-15 DIAGNOSIS — R635 Abnormal weight gain: Secondary | ICD-10-CM

## 2024-02-15 DIAGNOSIS — J0111 Acute recurrent frontal sinusitis: Secondary | ICD-10-CM | POA: Diagnosis not present

## 2024-02-15 MED ORDER — PREDNISONE 10 MG (21) PO TBPK
ORAL_TABLET | ORAL | 0 refills | Status: AC
  Filled 2024-02-15: qty 21, 6d supply, fill #0

## 2024-02-15 MED ORDER — DOXYCYCLINE HYCLATE 100 MG PO TABS
100.0000 mg | ORAL_TABLET | Freq: Two times a day (BID) | ORAL | 0 refills | Status: AC
  Filled 2024-02-15: qty 14, 7d supply, fill #0

## 2024-02-15 NOTE — Progress Notes (Signed)
 Acute Care Office Visit  Subjective:   Tammie Frederick [birth year] 02/15/2024  Chief Complaint  Patient presents with   Nasal Congestion    Pt has been having pain in sinuses, congestion, and ear pain which began about 3 weeks ago. States she did a telehealth visit and was prescribed abx but states symptoms have come back and are worse. Denies any fever.    HPI: Patient states she began having sinus symptoms approx. 3 weeks ago and she did a telehealth visit and was given Augmentin . She states symptoms improved but returned and have worsened. Reports right sided sinus pain with congestion, eye pressure and pain, right ear, dental pain and sore throat. She is taking Advil . She has had sinus surgery in the past.    WEIGHT CONCERN:  Patient is interested in cash pay for Glp 1 prescriptions. She is interested in wegovy use for attaining weight goals with diet and exercise.    The following portions of the patient's history were reviewed and updated as appropriate: past medical history, past surgical history, family history, social history, allergies, medications, and problem list.   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   Thrombocytosis 06/07/2023   RUQ abdominal pain 07/27/2022   Pre-menstrual mood disorder 12/24/2021   Attention deficit hyperactivity disorder (ADHD), combined type 12/24/2021   Arthrodesis status 12/28/2020   Bulging of cervical intervertebral disc 12/28/2020   Bulging of lumbar intervertebral disc 12/28/2020   GAD (generalized anxiety disorder) 12/28/2020   Dizziness 12/28/2020   Cervical disc disorder with radiculopathy 12/27/2020   Thyroid  nodule 05/05/2016   Chronic back pain 12/17/2015   Past Medical History:  Diagnosis Date   Allergy    AMA (advanced maternal age) multigravida 35+ 06/30/2019   Anemia    Anxiety    Asthma     occasional inhaler use   Back injury    Broken finger    H/O cone biopsy of cervix 03/27/2009   H/O sinus surgery    History of back surgery    History of vertebral fracture 05/05/2016   HPV (human papilloma virus) anogenital infection    IUGR (intrauterine growth restriction) affecting care of mother 04/15/2019   LGSIL on Pap smear of cervix 04/14/2019   Preterm labor    PTL starting @ 28 wks   Short cervical length during pregnancy 04/14/2019   Threatened preterm labor 04/15/2019   Vaginal Pap smear, abnormal    Past Surgical History:  Procedure Laterality Date   BACK SURGERY     CERVICAL BIOPSY  W/ LOOP ELECTRODE EXCISION     FRACTURE SURGERY     fixation of fracture using dynamic compression system   NASAL SINUS SURGERY     multiple   SPINE SURGERY  09/06/2010   L1 compression fracture   Family History  Problem Relation Age of Onset   Hyperlipidemia Mother    Depression Father    Anxiety disorder Father    Alcohol abuse Father    Drug abuse Father    Breast cancer Maternal Grandmother    Lung cancer Maternal Grandmother    Cancer Maternal Grandmother    Diabetes Paternal Grandfather    Outpatient Medications Prior to Visit  Medication Sig Dispense Refill   amphetamine -dextroamphetamine  (ADDERALL) 10 MG tablet Take 1 tablet (10 mg total) by mouth daily with breakfast. 30 tablet 0   betamethasone  dipropionate 0.05 %  cream Apply 1 Application topically 2 (two) times daily. 30 g 0   ibuprofen  (ADVIL ) 200 MG tablet Take 200 mg by mouth every 6 (six) hours as needed.     ipratropium (ATROVENT ) 0.02 % nebulizer solution Take 2.5 mLs (0.5 mg total) by nebulization 4 (four) times daily. 75 mL 3   loratadine  (CLARITIN ) 10 MG tablet Take by mouth.     methocarbamol  (ROBAXIN -750) 750 MG tablet Take 750 mg by mouth 4 (four) times daily.     Multiple Vitamin (MULTIVITAMIN PO) Take by mouth.     No facility-administered medications prior to visit.   Allergies  Allergen  Reactions   Sulfa Antibiotics Nausea And Vomiting and Other (See Comments)    Severe Stomach pain- Sulfa Abx only    Cetirizine Rash   Latex Swelling and Other (See Comments)    latex   Cauliflower [Brassica Oleracea] Itching    Itching of the mouth and throat   Sulfamethoxazole-Trimethoprim Other (See Comments)    Bactrim   Azithromycin  Other (See Comments)    Stomach pains  azithromycin      ROS: A complete ROS was performed with pertinent positives/negatives noted in the HPI. The remainder of the ROS are negative.    Objective:   Today's Vitals   02/15/24 0942  BP: 107/74  Pulse: 85  Temp: 97.9 F (36.6 C)  TempSrc: Oral  SpO2: 100%  Weight: 163 lb (73.9 kg)  Height: 5' 7 (1.702 m)    GENERAL: Well-appearing, in NAD. Well nourished.  SKIN: Pink, warm and dry.  Head: Normocephalic. Frontal and maxillary sinus tenderness.  NECK: Trachea midline. Full ROM w/o pain or tenderness. Mild right anterior cervical lymphadenopathy.  EARS: Tympanic membranes are intact, translucent without bulging and without drainage. Appropriate landmarks visualized.  EYES: Conjunctiva clear without exudates. EOMI, PERRL, no drainage present.  NOSE: Septum midline w/o deformity. Nares patent, mucosa pink and mildly -inflamed w/ clear drainage.  THROAT: Uvula midline. Oropharynx clear. Tonsils non-inflamed without exudate. Mucous membranes pink and moist.  RESPIRATORY: Chest wall symmetrical. Respirations even and non-labored. Breath sounds clear to auscultation bilaterally.  CARDIAC: S1, S2 present, regular rate and rhythm without murmur or gallops. Peripheral pulses 2+ bilaterally.  MSK: Muscle tone and strength appropriate for age.  NEUROLOGIC: No motor or sensory deficits. Steady, even gait. C2-C12 intact.  PSYCH/MENTAL STATUS: Alert, oriented x 3. Cooperative, appropriate mood and affect.    No results found for any visits on 02/15/24.    Assessment & Plan:  1. Acute recurrent frontal  sinusitis (Primary) Start Doxycycline  100mg  BID x 7 days with sterapred. Cautious use of steroid advised given use of stimulant for ADHD. Continue OTC treatment for symptom relief. If no improvement in 1 week, reach out to PCP.  - predniSONE  (STERAPRED UNI-PAK 21 TAB) 10 MG (21) TBPK tablet; Use as directed.  Dispense: 21 each; Refill: 0 - doxycycline  (VIBRA -TABS) 100 MG tablet; Take 1 tablet (100 mg total) by mouth 2 (two) times daily.  Dispense: 14 tablet; Refill: 0  2. Weight gain Discussed GLP 1 and possible side effects, adverse effects and use of medication. She would like further information on dosing of oral GLP 1 and PCP will reach out with further information from rep as this is starting in January 2026.    Meds ordered this encounter  Medications   predniSONE  (STERAPRED UNI-PAK 21 TAB) 10 MG (21) TBPK tablet    Sig: Use as directed.    Dispense:  21 each  Refill:  0    Supervising Provider:   DE CUBA, RAYMOND J [[id number]]   doxycycline  (VIBRA -TABS) 100 MG tablet    Sig: Take 1 tablet (100 mg total) by mouth 2 (two) times daily.    Dispense:  14 tablet    Refill:  0    Supervising Provider:   DE CUBA, RAYMOND J [[id number]]   Lab Orders  No laboratory test(s) ordered today    Return if symptoms worsen or fail to improve.    Patient to reach out to office if new, worrisome, or unresolved symptoms arise or if no improvement in patient's condition. Patient verbalized understanding and is agreeable to treatment plan. All questions answered to patient's satisfaction.    Thersia Schuyler Stark, [place]

## 2024-02-19 ENCOUNTER — Encounter (HOSPITAL_BASED_OUTPATIENT_CLINIC_OR_DEPARTMENT_OTHER): Payer: Self-pay | Admitting: Family Medicine

## 2024-02-24 ENCOUNTER — Other Ambulatory Visit (HOSPITAL_BASED_OUTPATIENT_CLINIC_OR_DEPARTMENT_OTHER): Payer: Self-pay | Admitting: Family Medicine

## 2024-02-24 DIAGNOSIS — F902 Attention-deficit hyperactivity disorder, combined type: Secondary | ICD-10-CM

## 2024-02-25 ENCOUNTER — Other Ambulatory Visit (HOSPITAL_BASED_OUTPATIENT_CLINIC_OR_DEPARTMENT_OTHER): Payer: Self-pay

## 2024-02-25 ENCOUNTER — Other Ambulatory Visit: Payer: Self-pay

## 2024-02-25 MED ORDER — AMPHETAMINE-DEXTROAMPHETAMINE 10 MG PO TABS
10.0000 mg | ORAL_TABLET | Freq: Every day | ORAL | 0 refills | Status: DC
  Filled 2024-02-25: qty 30, 30d supply, fill #0

## 2024-02-26 ENCOUNTER — Other Ambulatory Visit (HOSPITAL_BASED_OUTPATIENT_CLINIC_OR_DEPARTMENT_OTHER): Payer: Self-pay

## 2024-03-24 ENCOUNTER — Other Ambulatory Visit (HOSPITAL_BASED_OUTPATIENT_CLINIC_OR_DEPARTMENT_OTHER): Payer: Self-pay

## 2024-03-24 ENCOUNTER — Ambulatory Visit (INDEPENDENT_AMBULATORY_CARE_PROVIDER_SITE_OTHER)

## 2024-03-24 DIAGNOSIS — Z23 Encounter for immunization: Secondary | ICD-10-CM | POA: Diagnosis not present

## 2024-03-24 NOTE — Progress Notes (Signed)
 After obtaining consent, and per orders of Dr. De Cuba, injection of Influenza given by Rexene CHRISTELLA Shorter. Patient instructed to remain in clinic for 20 minutes afterwards, and to report any adverse reaction to me immediately.

## 2024-03-28 ENCOUNTER — Other Ambulatory Visit (HOSPITAL_BASED_OUTPATIENT_CLINIC_OR_DEPARTMENT_OTHER): Payer: Self-pay | Admitting: Family Medicine

## 2024-03-28 DIAGNOSIS — F902 Attention-deficit hyperactivity disorder, combined type: Secondary | ICD-10-CM

## 2024-04-01 ENCOUNTER — Encounter (HOSPITAL_BASED_OUTPATIENT_CLINIC_OR_DEPARTMENT_OTHER): Payer: Self-pay | Admitting: Family Medicine

## 2024-04-07 ENCOUNTER — Other Ambulatory Visit (HOSPITAL_BASED_OUTPATIENT_CLINIC_OR_DEPARTMENT_OTHER): Payer: Self-pay

## 2024-04-07 MED ORDER — AMPHETAMINE-DEXTROAMPHETAMINE 10 MG PO TABS
10.0000 mg | ORAL_TABLET | Freq: Every day | ORAL | 0 refills | Status: AC
  Filled 2024-04-07: qty 30, 30d supply, fill #0

## 2024-04-11 ENCOUNTER — Other Ambulatory Visit (HOSPITAL_BASED_OUTPATIENT_CLINIC_OR_DEPARTMENT_OTHER): Payer: Self-pay

## 2024-04-14 ENCOUNTER — Other Ambulatory Visit (HOSPITAL_BASED_OUTPATIENT_CLINIC_OR_DEPARTMENT_OTHER): Payer: Self-pay

## 2024-04-15 ENCOUNTER — Encounter (HOSPITAL_BASED_OUTPATIENT_CLINIC_OR_DEPARTMENT_OTHER): Payer: Self-pay | Admitting: Family Medicine

## 2024-04-24 ENCOUNTER — Other Ambulatory Visit (HOSPITAL_BASED_OUTPATIENT_CLINIC_OR_DEPARTMENT_OTHER): Payer: Self-pay | Admitting: Family Medicine

## 2024-06-10 ENCOUNTER — Encounter (HOSPITAL_BASED_OUTPATIENT_CLINIC_OR_DEPARTMENT_OTHER): Admitting: Family Medicine

## 2024-09-29 ENCOUNTER — Ambulatory Visit: Admitting: "Endocrinology
